# Patient Record
Sex: Female | Born: 1970 | Race: White | Hispanic: Yes | Marital: Married | Smoking: Never smoker
Health system: Southern US, Community
[De-identification: ages and names within clinical notes are randomized; demographics above are authoritative.]

## PROBLEM LIST (undated history)

## (undated) DIAGNOSIS — E785 Hyperlipidemia, unspecified: Secondary | ICD-10-CM

## (undated) DIAGNOSIS — M1289 Other specific arthropathies, not elsewhere classified, multiple sites: Secondary | ICD-10-CM

## (undated) DIAGNOSIS — N189 Chronic kidney disease, unspecified: Secondary | ICD-10-CM

## (undated) DIAGNOSIS — R7303 Prediabetes: Secondary | ICD-10-CM

## (undated) DIAGNOSIS — K219 Gastro-esophageal reflux disease without esophagitis: Secondary | ICD-10-CM

## (undated) HISTORY — DX: Chronic kidney disease, unspecified: N18.9

## (undated) HISTORY — DX: Hyperlipidemia, unspecified: E78.5

## (undated) HISTORY — DX: Prediabetes: R73.03

---

## 1995-05-31 DIAGNOSIS — M069 Rheumatoid arthritis, unspecified: Secondary | ICD-10-CM

## 1995-05-31 HISTORY — DX: Rheumatoid arthritis, unspecified: M06.9

## 2004-05-30 HISTORY — PX: TUBAL LIGATION: SHX77

## 2005-01-18 ENCOUNTER — Ambulatory Visit: Payer: Self-pay | Admitting: Internal Medicine

## 2005-01-21 ENCOUNTER — Ambulatory Visit: Payer: Self-pay | Admitting: Internal Medicine

## 2005-01-24 ENCOUNTER — Ambulatory Visit: Payer: Self-pay | Admitting: *Deleted

## 2005-01-28 LAB — CONVERTED CEMR LAB: Pap Smear: NEGATIVE

## 2005-02-23 ENCOUNTER — Ambulatory Visit: Payer: Self-pay | Admitting: Internal Medicine

## 2005-02-23 ENCOUNTER — Encounter (INDEPENDENT_AMBULATORY_CARE_PROVIDER_SITE_OTHER): Payer: Self-pay | Admitting: Internal Medicine

## 2005-02-23 LAB — CONVERTED CEMR LAB: Pap Smear: NORMAL

## 2005-04-25 ENCOUNTER — Ambulatory Visit: Payer: Self-pay | Admitting: Internal Medicine

## 2005-05-05 ENCOUNTER — Ambulatory Visit: Payer: Self-pay | Admitting: Internal Medicine

## 2005-06-09 ENCOUNTER — Ambulatory Visit: Payer: Self-pay | Admitting: Internal Medicine

## 2005-07-13 ENCOUNTER — Ambulatory Visit: Payer: Self-pay | Admitting: Internal Medicine

## 2005-09-07 ENCOUNTER — Ambulatory Visit: Payer: Self-pay | Admitting: Internal Medicine

## 2005-10-13 ENCOUNTER — Ambulatory Visit: Payer: Self-pay | Admitting: Obstetrics and Gynecology

## 2005-10-25 ENCOUNTER — Ambulatory Visit: Payer: Self-pay | Admitting: Internal Medicine

## 2005-11-07 ENCOUNTER — Ambulatory Visit (HOSPITAL_COMMUNITY): Admission: RE | Admit: 2005-11-07 | Discharge: 2005-11-07 | Payer: Self-pay | Admitting: Obstetrics and Gynecology

## 2005-11-07 ENCOUNTER — Ambulatory Visit: Payer: Self-pay | Admitting: Obstetrics and Gynecology

## 2005-11-17 ENCOUNTER — Ambulatory Visit (HOSPITAL_COMMUNITY): Admission: RE | Admit: 2005-11-17 | Discharge: 2005-11-17 | Payer: Self-pay | Admitting: Internal Medicine

## 2005-12-01 ENCOUNTER — Ambulatory Visit: Payer: Self-pay | Admitting: Obstetrics & Gynecology

## 2006-01-26 ENCOUNTER — Ambulatory Visit: Payer: Self-pay | Admitting: Internal Medicine

## 2006-04-06 ENCOUNTER — Ambulatory Visit: Payer: Self-pay | Admitting: Internal Medicine

## 2006-05-24 ENCOUNTER — Ambulatory Visit: Payer: Self-pay | Admitting: Internal Medicine

## 2006-06-27 ENCOUNTER — Ambulatory Visit: Payer: Self-pay | Admitting: Internal Medicine

## 2006-07-06 ENCOUNTER — Ambulatory Visit: Payer: Self-pay | Admitting: Internal Medicine

## 2006-08-15 ENCOUNTER — Ambulatory Visit: Payer: Self-pay | Admitting: Internal Medicine

## 2006-09-18 ENCOUNTER — Ambulatory Visit: Payer: Self-pay | Admitting: Internal Medicine

## 2007-01-06 ENCOUNTER — Encounter (INDEPENDENT_AMBULATORY_CARE_PROVIDER_SITE_OTHER): Payer: Self-pay | Admitting: Internal Medicine

## 2007-01-06 DIAGNOSIS — M069 Rheumatoid arthritis, unspecified: Secondary | ICD-10-CM | POA: Insufficient documentation

## 2007-01-16 ENCOUNTER — Ambulatory Visit: Payer: Self-pay | Admitting: Internal Medicine

## 2007-01-17 ENCOUNTER — Encounter (INDEPENDENT_AMBULATORY_CARE_PROVIDER_SITE_OTHER): Payer: Self-pay | Admitting: Internal Medicine

## 2007-02-14 ENCOUNTER — Encounter (INDEPENDENT_AMBULATORY_CARE_PROVIDER_SITE_OTHER): Payer: Self-pay | Admitting: *Deleted

## 2007-04-19 ENCOUNTER — Ambulatory Visit: Payer: Self-pay | Admitting: Nurse Practitioner

## 2007-04-19 DIAGNOSIS — J00 Acute nasopharyngitis [common cold]: Secondary | ICD-10-CM | POA: Insufficient documentation

## 2007-05-22 ENCOUNTER — Encounter (INDEPENDENT_AMBULATORY_CARE_PROVIDER_SITE_OTHER): Payer: Self-pay | Admitting: Internal Medicine

## 2007-05-22 ENCOUNTER — Ambulatory Visit: Payer: Self-pay | Admitting: Internal Medicine

## 2007-05-22 DIAGNOSIS — K089 Disorder of teeth and supporting structures, unspecified: Secondary | ICD-10-CM | POA: Insufficient documentation

## 2007-05-22 LAB — CONVERTED CEMR LAB
KOH Prep: NEGATIVE
Whiff Test: NEGATIVE

## 2007-05-23 ENCOUNTER — Encounter (INDEPENDENT_AMBULATORY_CARE_PROVIDER_SITE_OTHER): Payer: Self-pay | Admitting: Internal Medicine

## 2007-06-06 ENCOUNTER — Encounter (INDEPENDENT_AMBULATORY_CARE_PROVIDER_SITE_OTHER): Payer: Self-pay | Admitting: Internal Medicine

## 2007-06-06 LAB — CONVERTED CEMR LAB
AST: 18 units/L (ref 0–37)
Albumin: 4.3 g/dL (ref 3.5–5.2)
Alkaline Phosphatase: 46 units/L (ref 39–117)
BUN: 14 mg/dL (ref 6–23)
Basophils Absolute: 0 10*3/uL (ref 0.0–0.1)
Eosinophils Relative: 5 % (ref 0–5)
GC Probe Amp, Genital: NEGATIVE
LDL Cholesterol: 33 mg/dL (ref 0–99)
Lymphs Abs: 1.2 10*3/uL (ref 0.7–4.0)
MCV: 92.1 fL (ref 78.0–100.0)
Monocytes Absolute: 0.5 10*3/uL (ref 0.1–1.0)
Neutro Abs: 3 10*3/uL (ref 1.7–7.7)
Neutrophils Relative %: 60 % (ref 43–77)
Platelets: 280 10*3/uL (ref 150–400)
Potassium: 4.4 meq/L (ref 3.5–5.3)
RBC: 4.19 M/uL (ref 3.87–5.11)
RDW: 12.8 % (ref 11.5–15.5)
Total Bilirubin: 0.3 mg/dL (ref 0.3–1.2)
VLDL: 74 mg/dL — ABNORMAL HIGH (ref 0–40)

## 2007-06-13 ENCOUNTER — Encounter (INDEPENDENT_AMBULATORY_CARE_PROVIDER_SITE_OTHER): Payer: Self-pay | Admitting: Internal Medicine

## 2007-08-10 ENCOUNTER — Ambulatory Visit: Payer: Self-pay | Admitting: Internal Medicine

## 2007-08-10 DIAGNOSIS — R519 Headache, unspecified: Secondary | ICD-10-CM | POA: Insufficient documentation

## 2007-08-10 DIAGNOSIS — L84 Corns and callosities: Secondary | ICD-10-CM | POA: Insufficient documentation

## 2007-08-10 DIAGNOSIS — E785 Hyperlipidemia, unspecified: Secondary | ICD-10-CM

## 2007-08-10 DIAGNOSIS — R51 Headache: Secondary | ICD-10-CM

## 2007-08-13 ENCOUNTER — Ambulatory Visit: Payer: Self-pay | Admitting: Internal Medicine

## 2007-08-18 LAB — CONVERTED CEMR LAB
ALT: 12 units/L (ref 0–35)
Albumin: 4.1 g/dL (ref 3.5–5.2)
Basophils Relative: 0 % (ref 0–1)
CO2: 21 meq/L (ref 19–32)
Calcium: 9.1 mg/dL (ref 8.4–10.5)
Chloride: 104 meq/L (ref 96–112)
Eosinophils Relative: 4 % (ref 0–5)
Glucose, Bld: 85 mg/dL (ref 70–99)
Lymphocytes Relative: 23 % (ref 12–46)
Lymphs Abs: 1.2 10*3/uL (ref 0.7–4.0)
MCHC: 31.9 g/dL (ref 30.0–36.0)
Neutro Abs: 3.2 10*3/uL (ref 1.7–7.7)
Neutrophils Relative %: 63 % (ref 43–77)
Potassium: 4 meq/L (ref 3.5–5.3)
Sodium: 139 meq/L (ref 135–145)
Total Protein: 7.5 g/dL (ref 6.0–8.3)

## 2007-09-26 ENCOUNTER — Ambulatory Visit: Payer: Self-pay | Admitting: Family Medicine

## 2007-09-26 DIAGNOSIS — J301 Allergic rhinitis due to pollen: Secondary | ICD-10-CM

## 2007-09-26 LAB — CONVERTED CEMR LAB
AST: 18 units/L (ref 0–37)
Alkaline Phosphatase: 37 units/L — ABNORMAL LOW (ref 39–117)
BUN: 19 mg/dL (ref 6–23)
CO2: 21 meq/L (ref 19–32)
Creatinine, Ser: 0.65 mg/dL (ref 0.40–1.20)
Hemoglobin: 13 g/dL (ref 12.0–15.0)
MCV: 95.3 fL (ref 78.0–100.0)
Monocytes Absolute: 0.8 10*3/uL (ref 0.1–1.0)
Monocytes Relative: 10 % (ref 3–12)
Neutro Abs: 5.2 10*3/uL (ref 1.7–7.7)
Neutrophils Relative %: 66 % (ref 43–77)
Platelets: 283 10*3/uL (ref 150–400)
RDW: 13.5 % (ref 11.5–15.5)
Sodium: 138 meq/L (ref 135–145)
Total Bilirubin: 0.6 mg/dL (ref 0.3–1.2)
Total Protein: 6.8 g/dL (ref 6.0–8.3)

## 2007-10-04 ENCOUNTER — Ambulatory Visit: Payer: Self-pay | Admitting: Internal Medicine

## 2007-10-17 ENCOUNTER — Ambulatory Visit: Payer: Self-pay | Admitting: Internal Medicine

## 2007-11-20 ENCOUNTER — Encounter (INDEPENDENT_AMBULATORY_CARE_PROVIDER_SITE_OTHER): Payer: Self-pay | Admitting: Internal Medicine

## 2007-11-27 ENCOUNTER — Encounter (INDEPENDENT_AMBULATORY_CARE_PROVIDER_SITE_OTHER): Payer: Self-pay | Admitting: Internal Medicine

## 2007-11-27 ENCOUNTER — Ambulatory Visit: Payer: Self-pay | Admitting: Nurse Practitioner

## 2007-12-13 ENCOUNTER — Encounter (INDEPENDENT_AMBULATORY_CARE_PROVIDER_SITE_OTHER): Payer: Self-pay | Admitting: Internal Medicine

## 2007-12-19 LAB — CONVERTED CEMR LAB
ALT: 10 units/L (ref 0–35)
AST: 16 units/L (ref 0–37)
Albumin: 3.9 g/dL (ref 3.5–5.2)
BUN: 12 mg/dL (ref 6–23)
Basophils Absolute: 0 10*3/uL (ref 0.0–0.1)
Basophils Relative: 1 % (ref 0–1)
CO2: 22 meq/L (ref 19–32)
Calcium: 9.1 mg/dL (ref 8.4–10.5)
Chloride: 107 meq/L (ref 96–112)
Eosinophils Absolute: 0.2 10*3/uL (ref 0.0–0.7)
Eosinophils Relative: 4 % (ref 0–5)
Glucose, Bld: 84 mg/dL (ref 70–99)
Monocytes Absolute: 0.9 10*3/uL (ref 0.1–1.0)
Neutro Abs: 3 10*3/uL (ref 1.7–7.7)
Potassium: 4 meq/L (ref 3.5–5.3)
Sodium: 138 meq/L (ref 135–145)
Total Bilirubin: 0.7 mg/dL (ref 0.3–1.2)
WBC: 5.5 10*3/uL (ref 4.0–10.5)

## 2008-01-15 ENCOUNTER — Ambulatory Visit: Payer: Self-pay | Admitting: Internal Medicine

## 2008-01-22 LAB — CONVERTED CEMR LAB
ALT: 12 units/L (ref 0–35)
AST: 17 units/L (ref 0–37)
Albumin: 4.2 g/dL (ref 3.5–5.2)
Basophils Relative: 0 % (ref 0–1)
Chloride: 108 meq/L (ref 96–112)
Eosinophils Relative: 5 % (ref 0–5)
Glucose, Bld: 102 mg/dL — ABNORMAL HIGH (ref 70–99)
Lymphocytes Relative: 27 % (ref 12–46)
Monocytes Absolute: 0.7 10*3/uL (ref 0.1–1.0)
Sodium: 142 meq/L (ref 135–145)
Total Bilirubin: 0.7 mg/dL (ref 0.3–1.2)

## 2008-02-05 ENCOUNTER — Telehealth (INDEPENDENT_AMBULATORY_CARE_PROVIDER_SITE_OTHER): Payer: Self-pay | Admitting: *Deleted

## 2008-02-06 ENCOUNTER — Ambulatory Visit: Payer: Self-pay | Admitting: Internal Medicine

## 2008-02-06 DIAGNOSIS — N3000 Acute cystitis without hematuria: Secondary | ICD-10-CM | POA: Insufficient documentation

## 2008-02-06 LAB — CONVERTED CEMR LAB
Ketones, urine, test strip: NEGATIVE
Nitrite: NEGATIVE
Protein, U semiquant: NEGATIVE
WBC Urine, dipstick: NEGATIVE

## 2008-02-07 ENCOUNTER — Encounter (INDEPENDENT_AMBULATORY_CARE_PROVIDER_SITE_OTHER): Payer: Self-pay | Admitting: Internal Medicine

## 2008-04-07 ENCOUNTER — Telehealth (INDEPENDENT_AMBULATORY_CARE_PROVIDER_SITE_OTHER): Payer: Self-pay | Admitting: *Deleted

## 2008-04-10 ENCOUNTER — Ambulatory Visit: Payer: Self-pay | Admitting: Internal Medicine

## 2008-04-10 DIAGNOSIS — M545 Low back pain: Secondary | ICD-10-CM

## 2008-04-10 DIAGNOSIS — M25559 Pain in unspecified hip: Secondary | ICD-10-CM

## 2008-04-18 LAB — CONVERTED CEMR LAB
Alkaline Phosphatase: 44 units/L (ref 39–117)
BUN: 14 mg/dL (ref 6–23)
CO2: 24 meq/L (ref 19–32)
Calcium: 9.3 mg/dL (ref 8.4–10.5)
Chloride: 104 meq/L (ref 96–112)
Eosinophils Absolute: 0.4 10*3/uL (ref 0.0–0.7)
Eosinophils Relative: 7 % — ABNORMAL HIGH (ref 0–5)
Lymphocytes Relative: 32 % (ref 12–46)
MCHC: 32 g/dL (ref 30.0–36.0)
MCV: 95 fL (ref 78.0–100.0)
Monocytes Absolute: 0.8 10*3/uL (ref 0.1–1.0)
Neutro Abs: 2.5 10*3/uL (ref 1.7–7.7)
Neutrophils Relative %: 46 % (ref 43–77)
RBC: 4.41 M/uL (ref 3.87–5.11)
RDW: 13.4 % (ref 11.5–15.5)
Sodium: 140 meq/L (ref 135–145)
Total Bilirubin: 0.7 mg/dL (ref 0.3–1.2)
Total Protein: 7.7 g/dL (ref 6.0–8.3)

## 2008-05-21 ENCOUNTER — Ambulatory Visit: Payer: Self-pay | Admitting: Internal Medicine

## 2008-05-21 LAB — CONVERTED CEMR LAB
AST: 23 units/L (ref 0–37)
BUN: 12 mg/dL (ref 6–23)
Basophils Relative: 0 % (ref 0–1)
CO2: 22 meq/L (ref 19–32)
Calcium: 8.9 mg/dL (ref 8.4–10.5)
Chloride: 106 meq/L (ref 96–112)
Glucose, Bld: 76 mg/dL (ref 70–99)
Lymphs Abs: 1.5 10*3/uL (ref 0.7–4.0)
MCV: 94.1 fL (ref 78.0–100.0)
Monocytes Absolute: 0.6 10*3/uL (ref 0.1–1.0)
Monocytes Relative: 11 % (ref 3–12)
Neutrophils Relative %: 57 % (ref 43–77)
Sodium: 139 meq/L (ref 135–145)
Total Bilirubin: 0.5 mg/dL (ref 0.3–1.2)
Total Protein: 6.9 g/dL (ref 6.0–8.3)
WBC: 5.3 10*3/uL (ref 4.0–10.5)

## 2008-05-27 ENCOUNTER — Encounter (INDEPENDENT_AMBULATORY_CARE_PROVIDER_SITE_OTHER): Payer: Self-pay | Admitting: Internal Medicine

## 2008-05-27 ENCOUNTER — Ambulatory Visit: Payer: Self-pay | Admitting: Internal Medicine

## 2008-05-27 DIAGNOSIS — R109 Unspecified abdominal pain: Secondary | ICD-10-CM

## 2008-05-27 LAB — CONVERTED CEMR LAB: Bilirubin Urine: NEGATIVE

## 2008-05-28 ENCOUNTER — Encounter (INDEPENDENT_AMBULATORY_CARE_PROVIDER_SITE_OTHER): Payer: Self-pay | Admitting: Internal Medicine

## 2008-06-09 ENCOUNTER — Encounter (INDEPENDENT_AMBULATORY_CARE_PROVIDER_SITE_OTHER): Payer: Self-pay | Admitting: Internal Medicine

## 2008-06-17 ENCOUNTER — Ambulatory Visit: Payer: Self-pay | Admitting: Internal Medicine

## 2008-06-17 LAB — CONVERTED CEMR LAB
Glucose, Urine, Semiquant: NEGATIVE
Ketones, urine, test strip: NEGATIVE
Nitrite: NEGATIVE
Specific Gravity, Urine: 1.025
pH: 5

## 2008-06-18 ENCOUNTER — Encounter (INDEPENDENT_AMBULATORY_CARE_PROVIDER_SITE_OTHER): Payer: Self-pay | Admitting: Internal Medicine

## 2008-06-19 LAB — CONVERTED CEMR LAB
BUN: 15 mg/dL (ref 6–23)
Eosinophils Absolute: 0.1 10*3/uL (ref 0.0–0.7)
Glucose, Bld: 90 mg/dL (ref 70–99)
HCT: 42.9 % (ref 36.0–46.0)
Hemoglobin: 13.7 g/dL (ref 12.0–15.0)
MCV: 94.5 fL (ref 78.0–100.0)
Monocytes Absolute: 0.6 10*3/uL (ref 0.1–1.0)
Monocytes Relative: 12 % (ref 3–12)
Neutrophils Relative %: 61 % (ref 43–77)
RDW: 13.4 % (ref 11.5–15.5)
WBC: 4.7 10*3/uL (ref 4.0–10.5)

## 2008-06-24 ENCOUNTER — Ambulatory Visit: Payer: Self-pay | Admitting: Internal Medicine

## 2008-06-24 DIAGNOSIS — R3129 Other microscopic hematuria: Secondary | ICD-10-CM

## 2008-06-24 LAB — CONVERTED CEMR LAB
Glucose, Urine, Semiquant: NEGATIVE
Ketones, urine, test strip: NEGATIVE
Protein, U semiquant: NEGATIVE
pH: 5

## 2008-06-25 ENCOUNTER — Encounter (INDEPENDENT_AMBULATORY_CARE_PROVIDER_SITE_OTHER): Payer: Self-pay | Admitting: Internal Medicine

## 2008-06-27 ENCOUNTER — Ambulatory Visit (HOSPITAL_COMMUNITY): Admission: RE | Admit: 2008-06-27 | Discharge: 2008-06-27 | Payer: Self-pay | Admitting: Internal Medicine

## 2008-07-02 ENCOUNTER — Ambulatory Visit: Payer: Self-pay | Admitting: Internal Medicine

## 2008-07-03 LAB — CONVERTED CEMR LAB
AST: 15 units/L (ref 0–37)
Albumin: 3.9 g/dL (ref 3.5–5.2)
BUN: 16 mg/dL (ref 6–23)
CO2: 22 meq/L (ref 19–32)
Chloride: 106 meq/L (ref 96–112)
Cholesterol: 122 mg/dL (ref 0–200)
Creatinine, Ser: 0.69 mg/dL (ref 0.40–1.20)
Eosinophils Relative: 4 % (ref 0–5)
Glucose, Bld: 94 mg/dL (ref 70–99)
HCT: 41.7 % (ref 36.0–46.0)
HDL: 33 mg/dL — ABNORMAL LOW (ref >39)
Hemoglobin: 13.9 g/dL (ref 12.0–15.0)
Lymphocytes Relative: 21 % (ref 12–46)
Lymphs Abs: 0.9 10*3/uL (ref 0.7–4.0)
MCV: 92.5 fL (ref 78.0–100.0)
Monocytes Absolute: 0.4 10*3/uL (ref 0.1–1.0)
Monocytes Relative: 8 % (ref 3–12)
RDW: 13.2 % (ref 11.5–15.5)
Total Bilirubin: 0.6 mg/dL (ref 0.3–1.2)
Triglycerides: 237 mg/dL — ABNORMAL HIGH (ref <150)
WBC: 4.5 10*3/uL (ref 4.0–10.5)

## 2008-07-24 ENCOUNTER — Telehealth (INDEPENDENT_AMBULATORY_CARE_PROVIDER_SITE_OTHER): Payer: Self-pay | Admitting: Internal Medicine

## 2008-07-29 ENCOUNTER — Ambulatory Visit: Payer: Self-pay | Admitting: Internal Medicine

## 2008-07-30 ENCOUNTER — Encounter (INDEPENDENT_AMBULATORY_CARE_PROVIDER_SITE_OTHER): Payer: Self-pay | Admitting: Internal Medicine

## 2008-08-27 LAB — CONVERTED CEMR LAB
ALT: 11 units/L (ref 0–35)
AST: 16 units/L (ref 0–37)
Albumin: 4.2 g/dL (ref 3.5–5.2)
Alkaline Phosphatase: 41 units/L (ref 39–117)
BUN: 13 mg/dL (ref 6–23)
Band Neutrophils: 0 % (ref 0–10)
CO2: 27 meq/L (ref 19–32)
Calcium: 9 mg/dL (ref 8.4–10.5)
Creatinine, Ser: 0.68 mg/dL (ref 0.40–1.20)
Glucose, Bld: 86 mg/dL (ref 70–99)
Hemoglobin: 13 g/dL (ref 12.0–15.0)
MCV: 93.3 fL (ref 78.0–100.0)
Monocytes Relative: 15 % — ABNORMAL HIGH (ref 3–12)
RBC: 4.3 M/uL (ref 3.87–5.11)
Sodium: 137 meq/L (ref 135–145)

## 2008-09-05 ENCOUNTER — Ambulatory Visit: Payer: Self-pay | Admitting: Nurse Practitioner

## 2008-10-03 ENCOUNTER — Telehealth (INDEPENDENT_AMBULATORY_CARE_PROVIDER_SITE_OTHER): Payer: Self-pay | Admitting: *Deleted

## 2008-10-06 ENCOUNTER — Ambulatory Visit: Payer: Self-pay | Admitting: Internal Medicine

## 2008-10-08 ENCOUNTER — Encounter (INDEPENDENT_AMBULATORY_CARE_PROVIDER_SITE_OTHER): Payer: Self-pay | Admitting: Internal Medicine

## 2008-10-09 LAB — CONVERTED CEMR LAB
AST: 19 units/L (ref 0–37)
Albumin: 4.2 g/dL (ref 3.5–5.2)
BUN: 17 mg/dL (ref 6–23)
CO2: 22 meq/L (ref 19–32)
Chloride: 104 meq/L (ref 96–112)
Creatinine, Ser: 0.75 mg/dL (ref 0.40–1.20)
Eosinophils Absolute: 0.6 10*3/uL (ref 0.0–0.7)
Eosinophils Relative: 11 % — ABNORMAL HIGH (ref 0–5)
HCT: 39.1 % (ref 36.0–46.0)
Hemoglobin: 13.2 g/dL (ref 12.0–15.0)
Lymphocytes Relative: 27 % (ref 12–46)
Lymphs Abs: 1.5 10*3/uL (ref 0.7–4.0)
MCV: 89.9 fL (ref 78.0–100.0)
Monocytes Absolute: 0.5 10*3/uL (ref 0.1–1.0)
Monocytes Relative: 9 % (ref 3–12)
RBC: 4.35 M/uL (ref 3.87–5.11)
RDW: 13.3 % (ref 11.5–15.5)
Sodium: 137 meq/L (ref 135–145)
Total Protein: 7.3 g/dL (ref 6.0–8.3)

## 2008-10-14 ENCOUNTER — Emergency Department (HOSPITAL_COMMUNITY): Admission: EM | Admit: 2008-10-14 | Discharge: 2008-10-14 | Payer: Self-pay | Admitting: Emergency Medicine

## 2008-10-14 ENCOUNTER — Telehealth (INDEPENDENT_AMBULATORY_CARE_PROVIDER_SITE_OTHER): Payer: Self-pay | Admitting: Internal Medicine

## 2008-12-11 ENCOUNTER — Ambulatory Visit: Payer: Self-pay | Admitting: Internal Medicine

## 2008-12-11 LAB — CONVERTED CEMR LAB
AST: 16 units/L (ref 0–37)
Albumin: 4.2 g/dL (ref 3.5–5.2)
BUN: 11 mg/dL (ref 6–23)
Basophils Absolute: 0 10*3/uL (ref 0.0–0.1)
Beta hcg, urine, semiquantitative: NEGATIVE
Blood in Urine, dipstick: NEGATIVE
Eosinophils Absolute: 0.3 10*3/uL (ref 0.0–0.7)
Glucose, Urine, Semiquant: NEGATIVE
HCT: 36.1 % (ref 36.0–46.0)
Hemoglobin: 12.4 g/dL (ref 12.0–15.0)
Lymphocytes Relative: 29 % (ref 12–46)
MCHC: 34.3 g/dL (ref 30.0–36.0)
Monocytes Absolute: 0.7 10*3/uL (ref 0.1–1.0)
Monocytes Relative: 11 % (ref 3–12)
Neutrophils Relative %: 56 % (ref 43–77)
Platelets: 293 10*3/uL (ref 150–400)
RDW: 13.1 % (ref 11.5–15.5)
Total Bilirubin: 0.5 mg/dL (ref 0.3–1.2)
Urobilinogen, UA: 0.2

## 2008-12-15 ENCOUNTER — Encounter (INDEPENDENT_AMBULATORY_CARE_PROVIDER_SITE_OTHER): Payer: Self-pay | Admitting: Internal Medicine

## 2008-12-27 ENCOUNTER — Encounter (INDEPENDENT_AMBULATORY_CARE_PROVIDER_SITE_OTHER): Payer: Self-pay | Admitting: Internal Medicine

## 2009-02-05 ENCOUNTER — Telehealth (INDEPENDENT_AMBULATORY_CARE_PROVIDER_SITE_OTHER): Payer: Self-pay | Admitting: Internal Medicine

## 2009-02-13 ENCOUNTER — Ambulatory Visit: Payer: Self-pay | Admitting: Internal Medicine

## 2009-02-23 LAB — CONVERTED CEMR LAB
AST: 20 units/L (ref 0–37)
CO2: 24 meq/L (ref 19–32)
Eosinophils Absolute: 0.3 10*3/uL (ref 0.0–0.7)
Lymphs Abs: 1.8 10*3/uL (ref 0.7–4.0)
MCHC: 32.7 g/dL (ref 30.0–36.0)
MCV: 92.8 fL (ref 78.0–100.0)
Monocytes Relative: 14 % — ABNORMAL HIGH (ref 3–12)
Neutro Abs: 2.4 10*3/uL (ref 1.7–7.7)
Neutrophils Relative %: 46 % (ref 43–77)
Platelets: 276 10*3/uL (ref 150–400)
Potassium: 4.1 meq/L (ref 3.5–5.3)
RBC: 4.15 M/uL (ref 3.87–5.11)
RDW: 13.2 % (ref 11.5–15.5)
Total Protein: 6.7 g/dL (ref 6.0–8.3)
WBC: 5.3 10*3/uL (ref 4.0–10.5)

## 2009-03-25 ENCOUNTER — Encounter (INDEPENDENT_AMBULATORY_CARE_PROVIDER_SITE_OTHER): Payer: Self-pay | Admitting: Internal Medicine

## 2009-04-16 ENCOUNTER — Ambulatory Visit: Payer: Self-pay | Admitting: Internal Medicine

## 2009-05-11 LAB — CONVERTED CEMR LAB
ALT: 13 units/L (ref 0–35)
AST: 15 units/L (ref 0–37)
Basophils Absolute: 0 10*3/uL (ref 0.0–0.1)
CO2: 23 meq/L (ref 19–32)
Calcium: 8.5 mg/dL (ref 8.4–10.5)
Chloride: 104 meq/L (ref 96–112)
Creatinine, Ser: 0.77 mg/dL (ref 0.40–1.20)
Eosinophils Relative: 4 % (ref 0–5)
Glucose, Bld: 96 mg/dL (ref 70–99)
HCT: 39.8 % (ref 36.0–46.0)
Hemoglobin: 13 g/dL (ref 12.0–15.0)
Monocytes Absolute: 0.6 10*3/uL (ref 0.1–1.0)
Monocytes Relative: 11 % (ref 3–12)
Neutro Abs: 3 10*3/uL (ref 1.7–7.7)
Platelets: 264 10*3/uL (ref 150–400)
Potassium: 3.8 meq/L (ref 3.5–5.3)
Sodium: 138 meq/L (ref 135–145)
Total Protein: 7 g/dL (ref 6.0–8.3)
WBC: 5.5 10*3/uL (ref 4.0–10.5)

## 2009-05-15 ENCOUNTER — Ambulatory Visit: Payer: Self-pay | Admitting: Internal Medicine

## 2009-05-15 DIAGNOSIS — B373 Candidiasis of vulva and vagina: Secondary | ICD-10-CM

## 2009-05-15 LAB — CONVERTED CEMR LAB
Bilirubin Urine: NEGATIVE
Nitrite: NEGATIVE
Specific Gravity, Urine: 1.02
WBC Urine, dipstick: NEGATIVE
Whiff Test: NEGATIVE

## 2009-06-02 ENCOUNTER — Encounter (INDEPENDENT_AMBULATORY_CARE_PROVIDER_SITE_OTHER): Payer: Self-pay | Admitting: Internal Medicine

## 2009-06-02 ENCOUNTER — Ambulatory Visit: Payer: Self-pay | Admitting: Internal Medicine

## 2009-06-05 ENCOUNTER — Ambulatory Visit: Payer: Self-pay | Admitting: Internal Medicine

## 2009-06-05 LAB — CONVERTED CEMR LAB
ALT: 15 units/L (ref 0–35)
Alkaline Phosphatase: 33 units/L — ABNORMAL LOW (ref 39–117)
BUN: 14 mg/dL (ref 6–23)
Basophils Relative: 0 % (ref 0–1)
CO2: 21 meq/L (ref 19–32)
Chloride: 103 meq/L (ref 96–112)
Eosinophils Absolute: 0.3 10*3/uL (ref 0.0–0.7)
Hemoglobin: 13.5 g/dL (ref 12.0–15.0)
Lymphs Abs: 2.1 10*3/uL (ref 0.7–4.0)
MCV: 95.8 fL (ref 78.0–100.0)
Monocytes Absolute: 0.7 10*3/uL (ref 0.1–1.0)
RDW: 12.6 % (ref 11.5–15.5)
Sodium: 138 meq/L (ref 135–145)

## 2009-06-17 IMAGING — US US RENAL
1 series · 14 of 25 positions shown · non-contrast
Comparison: None

CLINICAL DATA: Microhematuria

RENAL/URINARY TRACT ULTRASOUND
TECHNIQUE: Complete ultrasound examination of the urinary tract
was performed including evaluation of the kidneys, renal collecting
systems, and urinary bladder.

[Series 1: us renal · 0.25mm/px · 14 of 29 slices shown]
[im 1/29]
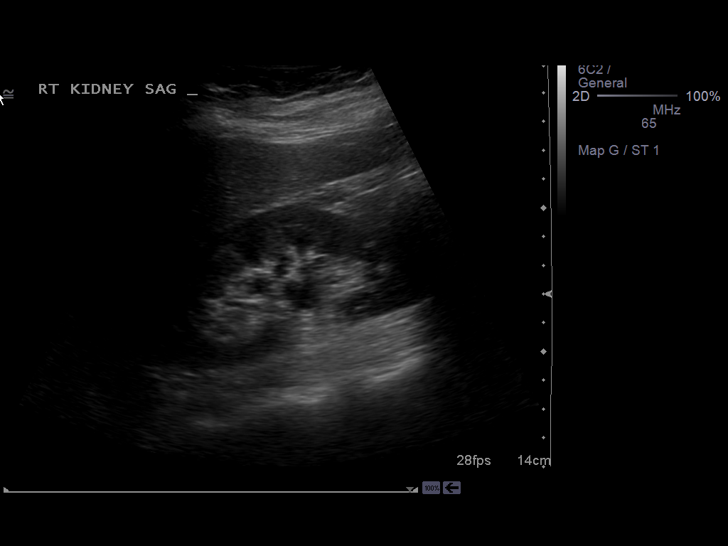
[im 3/29]
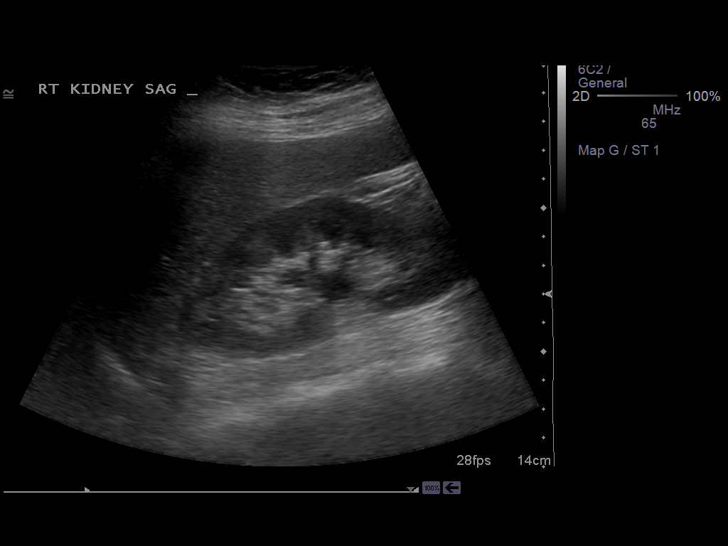
[im 5/29]
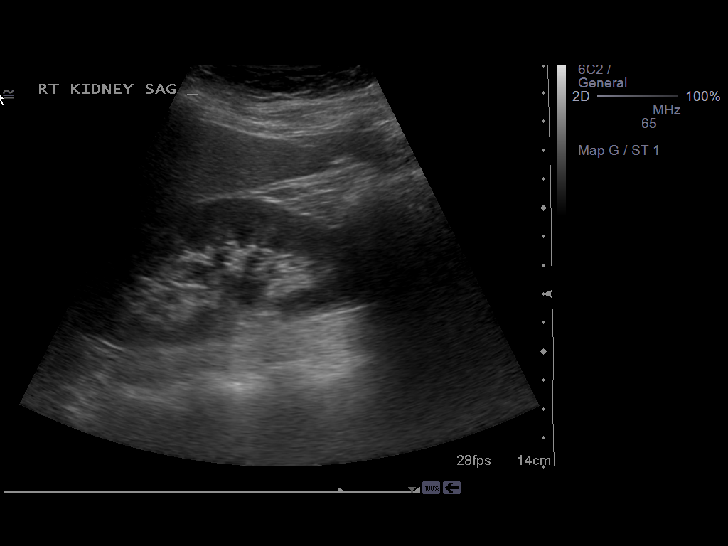
[im 8/29]
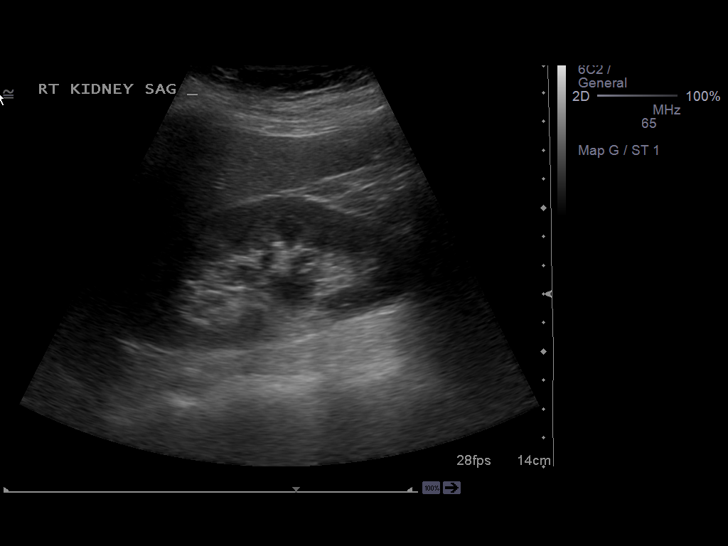
[im 10/29]
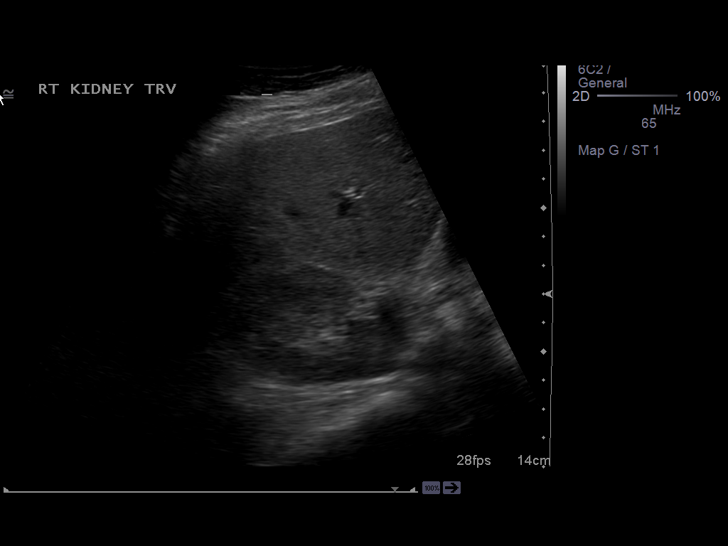
[im 11/29]
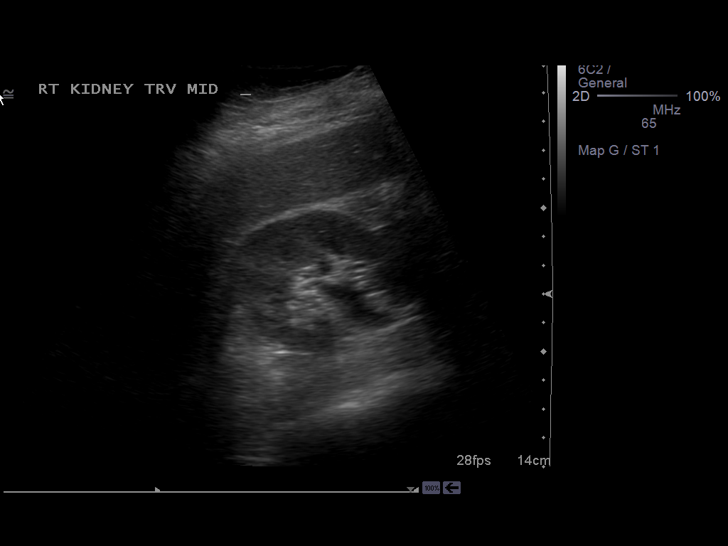
[im 13/29]
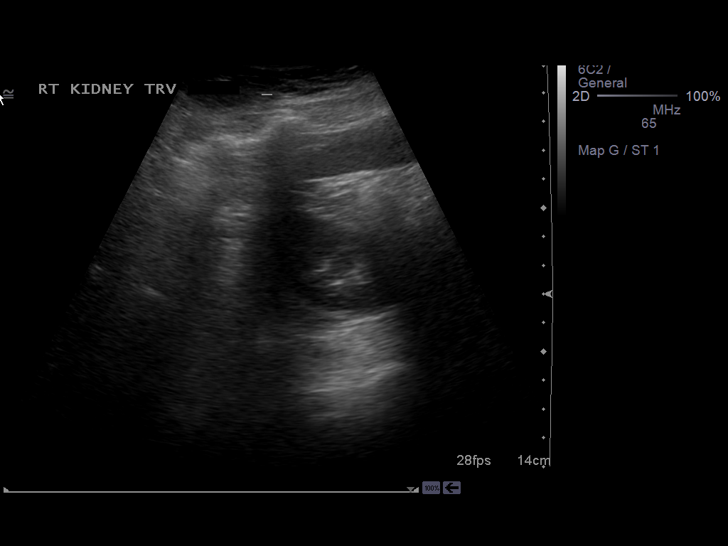
[im 16/29]
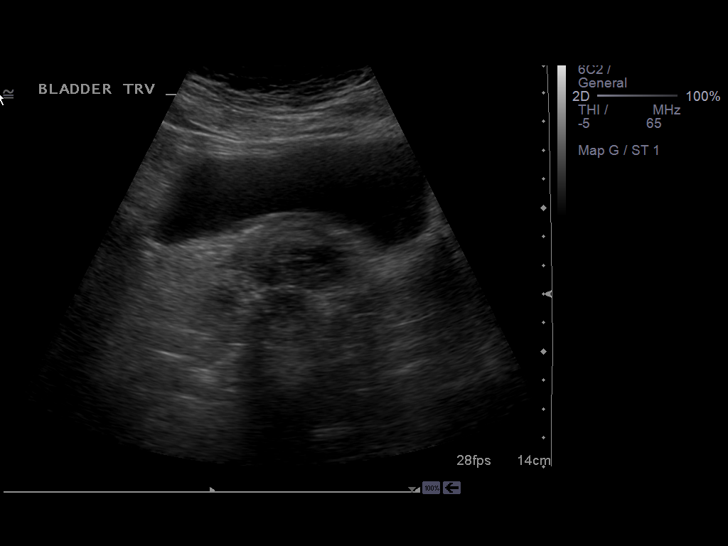
[im 18/29]
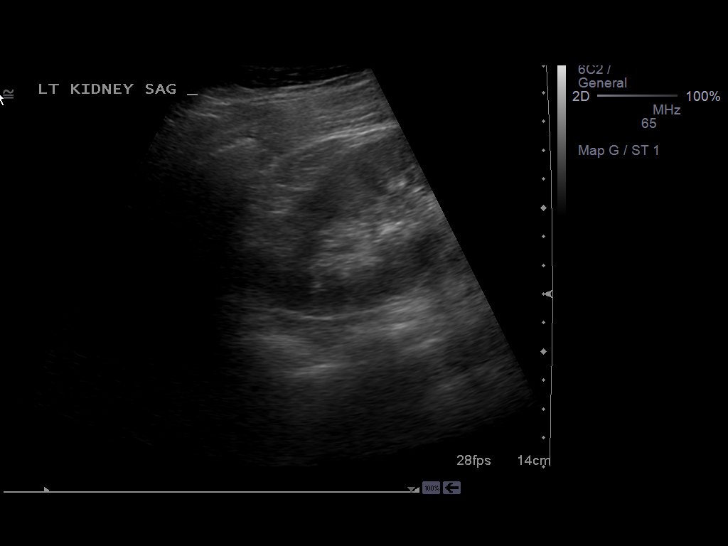
[im 19/29]
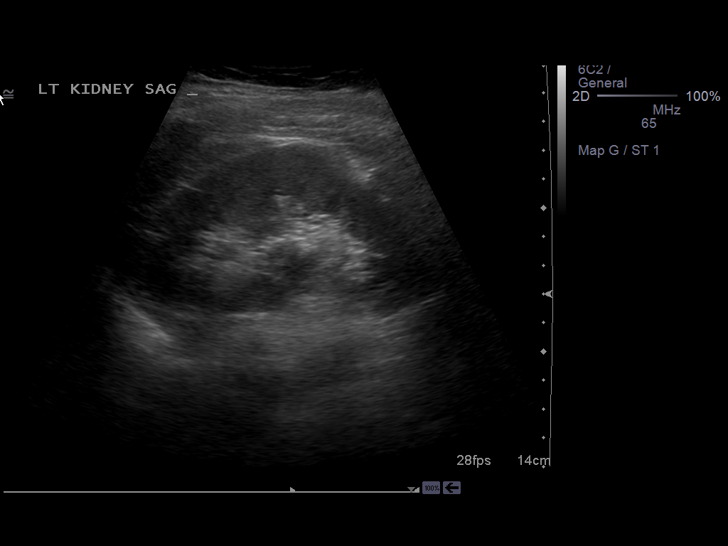
[im 22/29]
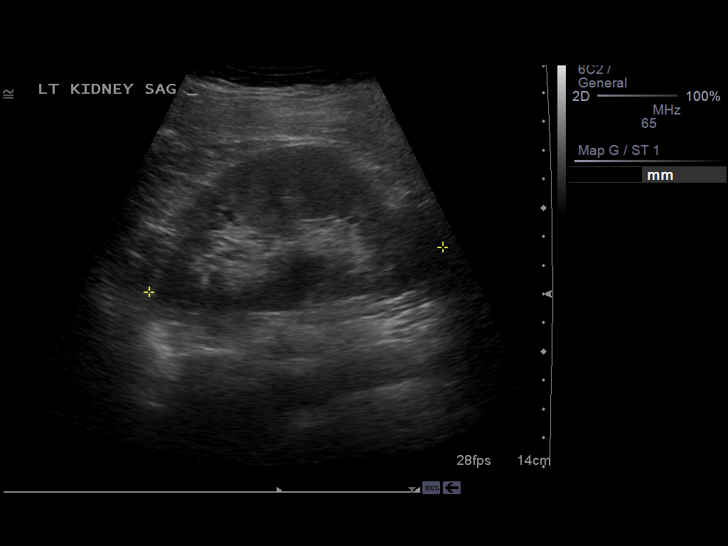
[im 24/29]
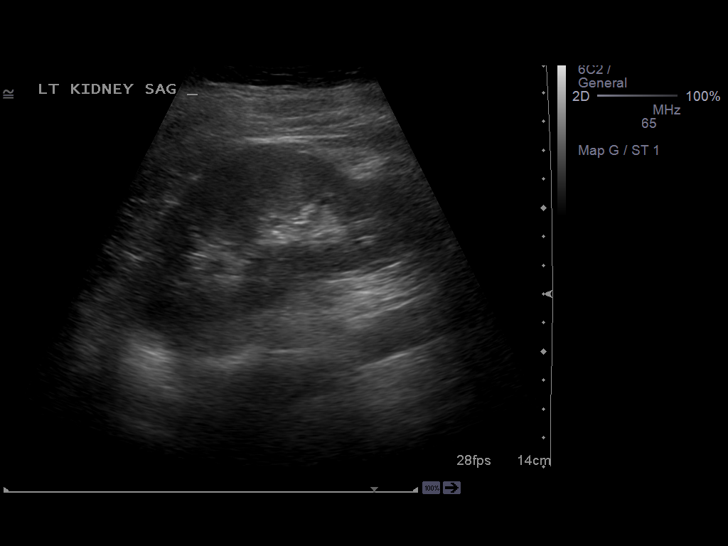
[im 26/29]
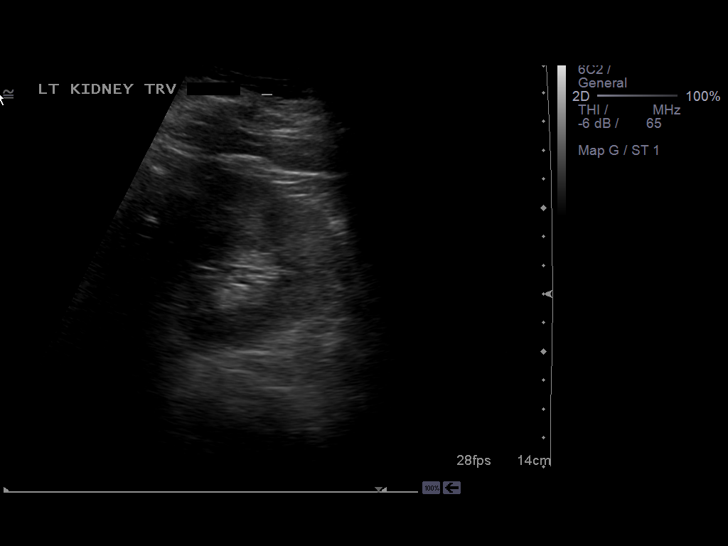
[im 29/29]
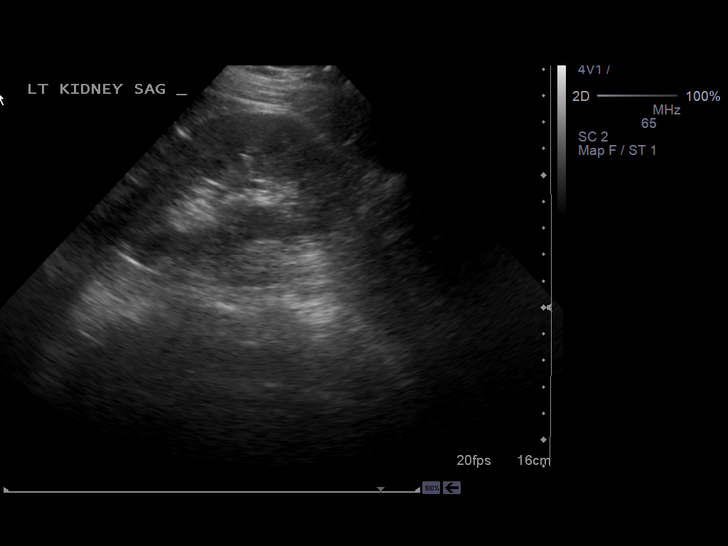

[14 of 25 positions shown; findings below may reference images not displayed]

FINDINGS: No hydronephrosis is seen.  The right kidney measures
10.8 cm sagittally, with left kidney measuring 10.4 cm.  No solid
or cystic renal lesion is seen.  No echogenic foci are noted to
indicate calculi.  Urinary bladder is unremarkable.
IMPRESSION: No hydronephrosis.  No renal mass.  No definite calculi.

## 2009-06-22 ENCOUNTER — Ambulatory Visit: Payer: Self-pay | Admitting: Internal Medicine

## 2009-06-23 ENCOUNTER — Encounter (INDEPENDENT_AMBULATORY_CARE_PROVIDER_SITE_OTHER): Payer: Self-pay | Admitting: Internal Medicine

## 2009-06-24 ENCOUNTER — Ambulatory Visit: Payer: Self-pay | Admitting: Physician Assistant

## 2009-06-24 ENCOUNTER — Encounter (INDEPENDENT_AMBULATORY_CARE_PROVIDER_SITE_OTHER): Payer: Self-pay | Admitting: Internal Medicine

## 2009-07-02 LAB — CONVERTED CEMR LAB
Collection Interval-CRCL: 24 hr
Creatinine 24 HR UR: 1562 mg/24hr (ref 700–1800)
Creatinine Clearance: 132 mL/min — ABNORMAL HIGH (ref 75–115)
Creatinine, Urine: 156.2 mg/dL

## 2009-07-09 ENCOUNTER — Encounter (INDEPENDENT_AMBULATORY_CARE_PROVIDER_SITE_OTHER): Payer: Self-pay | Admitting: Internal Medicine

## 2009-07-17 ENCOUNTER — Ambulatory Visit: Payer: Self-pay | Admitting: Internal Medicine

## 2009-08-01 LAB — CONVERTED CEMR LAB
ALT: 14 units/L (ref 0–35)
BUN: 12 mg/dL (ref 6–23)
Basophils Absolute: 0 10*3/uL (ref 0.0–0.1)
Basophils Relative: 1 % (ref 0–1)
CO2: 23 meq/L (ref 19–32)
Calcium: 8.7 mg/dL (ref 8.4–10.5)
Creatinine, Ser: 0.76 mg/dL (ref 0.40–1.20)
Eosinophils Absolute: 0.2 10*3/uL (ref 0.0–0.7)
Eosinophils Relative: 7 % — ABNORMAL HIGH (ref 0–5)
HCT: 39.2 % (ref 36.0–46.0)
Lymphocytes Relative: 32 % (ref 12–46)
MCHC: 32.4 g/dL (ref 30.0–36.0)
Monocytes Absolute: 0.4 10*3/uL (ref 0.1–1.0)
Monocytes Relative: 13 % — ABNORMAL HIGH (ref 3–12)
Neutro Abs: 1.6 10*3/uL — ABNORMAL LOW (ref 1.7–7.7)
Neutrophils Relative %: 48 % (ref 43–77)
RBC: 4.17 M/uL (ref 3.87–5.11)
Sodium: 140 meq/L (ref 135–145)
WBC: 3.3 10*3/uL — ABNORMAL LOW (ref 4.0–10.5)

## 2009-08-04 ENCOUNTER — Ambulatory Visit: Payer: Self-pay | Admitting: Internal Medicine

## 2009-08-04 DIAGNOSIS — R1084 Generalized abdominal pain: Secondary | ICD-10-CM | POA: Insufficient documentation

## 2009-08-04 LAB — CONVERTED CEMR LAB
ALT: 9 units/L (ref 0–35)
AST: 16 units/L (ref 0–37)
Albumin: 4.5 g/dL (ref 3.5–5.2)
Alkaline Phosphatase: 33 units/L — ABNORMAL LOW (ref 39–117)
Basophils Relative: 1 % (ref 0–1)
Bilirubin Urine: NEGATIVE
Calcium: 9.1 mg/dL (ref 8.4–10.5)
Cholesterol: 138 mg/dL (ref 0–200)
Eosinophils Absolute: 0.2 10*3/uL (ref 0.0–0.7)
Glucose, Bld: 85 mg/dL (ref 70–99)
Glucose, Urine, Semiquant: NEGATIVE
HDL: 29 mg/dL — ABNORMAL LOW (ref >39)
Ketones, urine, test strip: NEGATIVE
LDL Cholesterol: 72 mg/dL (ref 0–99)
Lymphocytes Relative: 27 % (ref 12–46)
Monocytes Absolute: 0.8 10*3/uL (ref 0.1–1.0)
Monocytes Relative: 13 % — ABNORMAL HIGH (ref 3–12)
Neutro Abs: 3.4 10*3/uL (ref 1.7–7.7)
Neutrophils Relative %: 56 % (ref 43–77)
Platelets: 280 10*3/uL (ref 150–400)
RDW: 13 % (ref 11.5–15.5)
Sodium: 139 meq/L (ref 135–145)
Specific Gravity, Urine: 1.015
Total Bilirubin: 0.5 mg/dL (ref 0.3–1.2)
Total CHOL/HDL Ratio: 4.8

## 2009-08-05 ENCOUNTER — Encounter (INDEPENDENT_AMBULATORY_CARE_PROVIDER_SITE_OTHER): Payer: Self-pay | Admitting: Internal Medicine

## 2009-08-07 ENCOUNTER — Encounter (INDEPENDENT_AMBULATORY_CARE_PROVIDER_SITE_OTHER): Payer: Self-pay | Admitting: Internal Medicine

## 2009-08-28 ENCOUNTER — Ambulatory Visit: Payer: Self-pay | Admitting: Internal Medicine

## 2009-08-28 LAB — CONVERTED CEMR LAB
Ketones, urine, test strip: NEGATIVE
Nitrite: NEGATIVE
Specific Gravity, Urine: >=1.03
Urobilinogen, UA: 0.2
WBC Urine, dipstick: NEGATIVE
pH: 6

## 2009-09-06 ENCOUNTER — Telehealth (INDEPENDENT_AMBULATORY_CARE_PROVIDER_SITE_OTHER): Payer: Self-pay | Admitting: Internal Medicine

## 2009-09-06 ENCOUNTER — Encounter (INDEPENDENT_AMBULATORY_CARE_PROVIDER_SITE_OTHER): Payer: Self-pay | Admitting: Internal Medicine

## 2009-09-06 LAB — CONVERTED CEMR LAB: Pap Smear: NEGATIVE

## 2009-09-07 LAB — CONVERTED CEMR LAB
Albumin: 4.5 g/dL (ref 3.5–5.2)
Alkaline Phosphatase: 38 units/L — ABNORMAL LOW (ref 39–117)
Chloride: 104 meq/L (ref 96–112)
Creatinine, Ser: 0.71 mg/dL (ref 0.40–1.20)
Eosinophils Absolute: 0.2 10*3/uL (ref 0.0–0.7)
Glucose, Bld: 94 mg/dL (ref 70–99)
Hemoglobin: 14 g/dL (ref 12.0–15.0)
Lymphocytes Relative: 34 % (ref 12–46)
Lymphs Abs: 1.7 10*3/uL (ref 0.7–4.0)
MCHC: 32.8 g/dL (ref 30.0–36.0)
MCV: 95.7 fL (ref 78.0–100.0)
Neutrophils Relative %: 48 % (ref 43–77)
Potassium: 4.4 meq/L (ref 3.5–5.3)
Total Bilirubin: 0.8 mg/dL (ref 0.3–1.2)
Total Protein: 7.8 g/dL (ref 6.0–8.3)

## 2009-09-08 ENCOUNTER — Ambulatory Visit: Payer: Self-pay | Admitting: Internal Medicine

## 2009-09-08 LAB — CONVERTED CEMR LAB: Rhuematoid fact SerPl-aCnc: 161 intl units/mL — ABNORMAL HIGH (ref 0–20)

## 2009-10-05 ENCOUNTER — Encounter (INDEPENDENT_AMBULATORY_CARE_PROVIDER_SITE_OTHER): Payer: Self-pay | Admitting: Internal Medicine

## 2009-11-06 ENCOUNTER — Encounter (INDEPENDENT_AMBULATORY_CARE_PROVIDER_SITE_OTHER): Payer: Self-pay | Admitting: Internal Medicine

## 2009-11-27 ENCOUNTER — Ambulatory Visit: Payer: Self-pay | Admitting: Internal Medicine

## 2009-12-01 ENCOUNTER — Encounter (INDEPENDENT_AMBULATORY_CARE_PROVIDER_SITE_OTHER): Payer: Self-pay | Admitting: Internal Medicine

## 2009-12-01 LAB — CONVERTED CEMR LAB
ALT: 11 units/L (ref 0–35)
AST: 20 units/L (ref 0–37)
Albumin: 4.3 g/dL (ref 3.5–5.2)
Alkaline Phosphatase: 37 units/L — ABNORMAL LOW (ref 39–117)
BUN: 12 mg/dL (ref 6–23)
Basophils Relative: 1 % (ref 0–1)
CO2: 27 meq/L (ref 19–32)
Calcium: 9.4 mg/dL (ref 8.4–10.5)
Creatinine, Ser: 0.72 mg/dL (ref 0.40–1.20)
Eosinophils Relative: 5 % (ref 0–5)
Lymphocytes Relative: 32 % (ref 12–46)
MCHC: 31.9 g/dL (ref 30.0–36.0)
Monocytes Absolute: 0.7 10*3/uL (ref 0.1–1.0)
Neutrophils Relative %: 47 % (ref 43–77)
Platelets: 252 10*3/uL (ref 150–400)
RDW: 12.9 % (ref 11.5–15.5)
Total Protein: 6.9 g/dL (ref 6.0–8.3)

## 2010-01-11 ENCOUNTER — Telehealth (INDEPENDENT_AMBULATORY_CARE_PROVIDER_SITE_OTHER): Payer: Self-pay | Admitting: *Deleted

## 2010-01-11 ENCOUNTER — Ambulatory Visit: Payer: Self-pay | Admitting: Internal Medicine

## 2010-01-12 DIAGNOSIS — R7309 Other abnormal glucose: Secondary | ICD-10-CM | POA: Insufficient documentation

## 2010-01-12 LAB — CONVERTED CEMR LAB
ALT: 16 units/L (ref 0–35)
AST: 18 units/L (ref 0–37)
BUN: 11 mg/dL (ref 6–23)
Basophils Absolute: 0 10*3/uL (ref 0.0–0.1)
CO2: 23 meq/L (ref 19–32)
Calcium: 8.9 mg/dL (ref 8.4–10.5)
Creatinine, Ser: 0.82 mg/dL (ref 0.40–1.20)
Eosinophils Absolute: 0.3 10*3/uL (ref 0.0–0.7)
Eosinophils Relative: 6 % — ABNORMAL HIGH (ref 0–5)
HCT: 40.4 % (ref 36.0–46.0)
Lymphs Abs: 1.7 10*3/uL (ref 0.7–4.0)
MCV: 94.6 fL (ref 78.0–100.0)
Monocytes Absolute: 0.7 10*3/uL (ref 0.1–1.0)
Monocytes Relative: 12 % (ref 3–12)
Neutro Abs: 2.9 10*3/uL (ref 1.7–7.7)
Neutrophils Relative %: 51 % (ref 43–77)
Platelets: 279 10*3/uL (ref 150–400)
RDW: 13.1 % (ref 11.5–15.5)
Sodium: 138 meq/L (ref 135–145)
WBC: 5.6 10*3/uL (ref 4.0–10.5)

## 2010-01-27 ENCOUNTER — Telehealth (INDEPENDENT_AMBULATORY_CARE_PROVIDER_SITE_OTHER): Payer: Self-pay | Admitting: Internal Medicine

## 2010-01-27 ENCOUNTER — Ambulatory Visit: Payer: Self-pay | Admitting: Internal Medicine

## 2010-01-27 DIAGNOSIS — R300 Dysuria: Secondary | ICD-10-CM

## 2010-01-27 LAB — CONVERTED CEMR LAB
Bilirubin Urine: NEGATIVE
Nitrite: NEGATIVE

## 2010-01-28 ENCOUNTER — Encounter (INDEPENDENT_AMBULATORY_CARE_PROVIDER_SITE_OTHER): Payer: Self-pay | Admitting: Internal Medicine

## 2010-03-29 ENCOUNTER — Ambulatory Visit: Payer: Self-pay | Admitting: Internal Medicine

## 2010-04-03 LAB — CONVERTED CEMR LAB
HDL: 29 mg/dL — ABNORMAL LOW (ref >39)
Triglycerides: 323 mg/dL — ABNORMAL HIGH (ref <150)
VLDL: 65 mg/dL — ABNORMAL HIGH (ref 0–40)

## 2010-04-27 ENCOUNTER — Ambulatory Visit: Payer: Self-pay | Admitting: Nurse Practitioner

## 2010-04-27 ENCOUNTER — Encounter (INDEPENDENT_AMBULATORY_CARE_PROVIDER_SITE_OTHER): Payer: Self-pay | Admitting: Internal Medicine

## 2010-05-06 ENCOUNTER — Ambulatory Visit: Payer: Self-pay | Admitting: Internal Medicine

## 2010-05-10 ENCOUNTER — Ambulatory Visit: Payer: Self-pay | Admitting: Internal Medicine

## 2010-05-10 ENCOUNTER — Encounter (INDEPENDENT_AMBULATORY_CARE_PROVIDER_SITE_OTHER): Payer: Self-pay | Admitting: Internal Medicine

## 2010-05-23 ENCOUNTER — Encounter (INDEPENDENT_AMBULATORY_CARE_PROVIDER_SITE_OTHER): Payer: Self-pay | Admitting: Internal Medicine

## 2010-06-22 LAB — CONVERTED CEMR LAB
HDL: 32 mg/dL — ABNORMAL LOW (ref >39)
Total CHOL/HDL Ratio: 4.2
VLDL: 43 mg/dL — ABNORMAL HIGH (ref 0–40)

## 2010-06-29 NOTE — Miscellaneous (Signed)
Summary: Urology microscopic hematuria eval doc.  Dr. Vernie Ammons  Clinical Lists Changes  Problems: Changed problem from MICROSCOPIC HEMATURIA (ICD-599.72) to MICROSCOPIC HEMATURIA (BMW-413.24) - Evaluated 08/07/2008 by Dr. Vernie Ammons, Urology.  work up:  renal ultrasound:  Normal.  URine culture negative, CMEt, CBC normal, urine cytology revealed some atypical cells, but cystoscopy was normal.  Abnormal cells felt secondary to probable bladder inflammation with what was felt to be cystitis at the time of testing. Orders: Added new Referral order of Nephrology Referral (Nephro) - Signed

## 2010-06-29 NOTE — Assessment & Plan Note (Signed)
Summary: CPP EXAM//GK   Vital Signs:  Patient profile:   40 year old female Menstrual status:  regular LMP:     07/14/2009 Weight:      174.6 pounds Temp:     97.9 degrees F Pulse rate:   87 / minute Pulse rhythm:   regular Resp:     16 per minute BP sitting:   113 / 69  (left arm) Cuff size:   regular  Vitals Entered By: Vesta Mixer CMA (August 04, 2009 11:42 AM) CC: CPP Is Patient Diabetic? No Pain Assessment Patient in pain? no       Does patient need assistance? Ambulation Normal LMP (date): 07/14/2009 LMP - Character: normal LMP - Reliable? Yes Menarche (age onset years): 13    Menstrual flow (days): 3 On BCP's at conception: yes Enter LMP: 07/14/2009 Last PAP Result NEGATIVE FOR INTRAEPITHELIAL LESIONS OR MALIGNANCY.   Primary Care Provider:  Julieanne Manson MD  CC:  CPP.  History of Present Illness: 40 yo female here for CPP.  Concerns:  1.  Recurrent microscopic hematuria.  Pt. did have a urologic evaluation in past, but have not seen the records regarding that--had cystocopy apparently.  Did note yesterday that those records are in and have not fully evaluated yet.  Discussed may need nephrology referral.  2.  Rheumatoid Arthritis:  following with Dr. Mechele Dawley at [facility].  WBC level was low (3.3) with last check in [facility] recheck it was apparently fine.  Pt. does have an appt. for follow up labs already scheduled.  She was told to follow upas she has been doing.  Pt. on hydroxychloroquine.  Has not had an eye check this year.    Habits & Providers  Alcohol-Tobacco-Diet     Alcohol drinks/day: 0     Tobacco Status: never  Exercise-Depression-Behavior     Drug Use: never  Allergies (verified): No Known Drug Allergies  Past History:  Past Surgical History: 1.  Bilateral Tubal ligation  Family History: Reviewed history from 05/22/2007 and no changes required. Mother 50:  stroke at age 52 Father 32:  healthy 3 brothers:  healthy 1  sister:  healthy 2 children  87 and 49:  26 yo daughter with MR.  Social History: Reviewed history from 05/27/2008 and no changes required. Lives at home with husband and 2 children Housewife; also works as Orthoptist person Originally from [place] been in [employer]. 15 years.Drug Use:  never  Review of Systems General:  Energy--a bit low. Eyes:  Denies blurring; No peripheral or color vision problems. ENT:  Denies decreased hearing. CV:  Denies chest pain or discomfort and palpitations. Resp:  Complains of shortness of breath; Sometimes at night when lies down to sleep.  Lasts about 10 minutes.Marland Kitchen GI:  Complains of abdominal pain; Suprapubic disomfort.  Like sharp pain in the area.  Can last on and off all day.  No dysuria.  Has not noted any gross hematuria.  Does have urinary frequency.  Does not feel like she is emptying fully. Pt.  also with constipation.  --started 2 days ago with LUQ pain.  Cannot describe pain, but is mild and intermittent.  Better if passes stool, which is a bit hard.  Normally not constipated.  Does not drink much in way of water.  Does not eat much in way of fruits and veggies.  . GU:  No vaginal discharge, just irritation.  Not on any antibiotics recently.  No odor.. MS:  Denies joint pain, joint  redness, and joint swelling. Derm:  Denies lesion(s) and rash. Neuro:  Denies numbness, tingling, and weakness. Psych:  Denies anxiety, depression, and suicidal thoughts/plans.  Physical Exam  General:  Well-developed,well-nourished,in no acute distress; alert,appropriate and cooperative throughout examination Head:  Normocephalic and atraumatic without obvious abnormalities. No apparent alopecia or balding. Eyes:  No corneal or conjunctival inflammation noted. EOMI. Perrla. Funduscopic exam benign, without hemorrhages, exudates or papilledema. Vision grossly normal. Ears:  External ear exam shows no significant lesions or deformities.  Otoscopic examination reveals  clear canals, tympanic membranes are intact bilaterally without bulging, retraction, inflammation or discharge. Hearing is grossly normal bilaterally. Nose:  External nasal examination shows no deformity or inflammation. Nasal mucosa are pink and moist without lesions or exudates. Mouth:  Oral mucosa and oropharynx without lesions or exudates.  Teeth in good repair. Neck:  No deformities, masses, or tenderness noted. Breasts:  No mass, nodules, thickening, tenderness, bulging, retraction, inflamation, nipple discharge or skin changes noted.   Lungs:  Normal respiratory effort, chest expands symmetrically. Lungs are clear to auscultation, no crackles or wheezes. Heart:  Normal rate and regular rhythm. S1 and S2 normal without gallop, murmur, click, rub or other extra sounds. Abdomen:  Entire abdomen with mild tenderness.  No HSM or mass.  BS + throughout.  Suprapubic area mildly tender as well,  but no worse than rest of abdomen.  No rebound or peritoneal signs Genitalia:  Pelvic Exam:        External: normal female genitalia without lesions or masses        Vagina: normal without lesions or masses.  Scant thin white discharge.  Minimal inflammation of canal.        Cervix: normal without lesions or masses        Adnexa: normal bimanual exam without masses or fullness        Uterus: normal by palpation        Pap smear: performed Msk:  No deformity or scoliosis noted of thoracic or lumbar spine.   Pulses:  R and L carotid,radial,femoral,dorsalis pedis and posterior tibial pulses are full and equal bilaterally Extremities:  No clubbing, cyanosis, edema, or deformity noted with normal full range of motion of all joints.   Neurologic:  No cranial nerve deficits noted. Station and gait are normal. Plantar reflexes are down-going bilaterally. DTRs are symmetrical throughout. Sensory, motor and coordinative functions appear intact. Skin:  Intact without suspicious lesions or rashes Cervical Nodes:  No  lymphadenopathy noted Axillary Nodes:  No palpable lymphadenopathy Inguinal Nodes:  No significant adenopathy Psych:  Cognition and judgment appear intact. Alert and cooperative with normal attention span and concentration. No apparent delusions, illusions, hallucinations   Impression & Recommendations:  Problem # 1:  ROUTINE GYNECOLOGICAL EXAMINATION (ICD-V72.31)  Orders: KOH/ WET Mount [phone]) Pap Smear, Thin Prep ( Collection of) [phone]) T- GC Chlamydia ([address]782) T-HIV Antibody  (Reflex) [id number]) T-Pap Smear, Thin Prep (78469) T-Syphilis Test (RPR) [phone])  Problem # 2:  ABDOMINAL PAIN, GENERALIZED (ICD-789.07) May just be constipation--discussed better diet  Orders: T-Comprehensive Metabolic Panel ([id number]) T-CBC w/Diff ([id number])  Problem # 3:  MICROSCOPIC HEMATURIA (ICD-599.72) With suprapubic pain and frequency Need to look at Urology notes--may need to see nephrology, though creatinine had been fine Orders: T-Culture, Urine ([id number])  Problem # 4:  DYSLIPIDEMIA (ICD-272.4)  Orders: T-Lipid Profile ([id number])  Problem # 5:  VAGINITIS, CANDIDAL (ICD-112.1)  Her updated medication list for this problem includes:    Fluconazole 150 Mg Tabs (Fluconazole) .Marland KitchenMarland KitchenMarland KitchenMarland Kitchen  1 tab by mouth daily for 2 days  Complete Medication List: 1)  Ferrous Sulfate 325 (65 Fe) Mg Tabs (Ferrous sulfate) .... Tomar una tableta diaria (take one tablet once a day)  generic for ferrous sulf 325mg  2)  Folic Acid 1 Mg Tabs (Folic acid) .... Tomar 1 tableta diaria (take one tablet every day) 3)  Humira 40 Mg/0.24ml Kit (Adalimumab) .... Inject 40 mg subcutaneously once every other week 4)  Hydroxychloroquine Sulfate 200 Mg Tabs (Hydroxychloroquine sulfate) .... Tomar 1 tableta dos veces al dia. (take one tablet twice daily) 5)  Methotrexate 2.5 Mg Tabs (Methotrexate sodium) .... Toma ocho tabletas cada domingo (take 8 tablets every sunday) 6)  Protonix 40 Mg Tbec (Pantoprazole  sodium) .... Tomar una tableta diaria (take one tablet once a day) 7)  Xyzal 5 Mg Tabs (Levocetirizine dihydrochloride) .... 1 tab by mouth daily as needed allergies 8)  Nasacort Aq 55 Mcg/act Aers (Triamcinolone acetonide(nasal)) .... 1 spray in each nostril two times a day 9)  Optivar 0.05 % Soln (Azelastine hcl) .... 1 drop in both eyes two times a day 10)  Fluconazole 150 Mg Tabs (Fluconazole) .... 1 tab by mouth daily for 2 days Prescriptions: XYZAL 5 MG TABS (LEVOCETIRIZINE DIHYDROCHLORIDE) 1 tab by mouth daily as needed allergies  #30 x 11   Entered and Authorized by:   Lemoine Goyne MD   Signed by:   Rhilynn Preyer MD on 08/04/2009   Method used:   Faxed to ...       [facility])       [address]       [place], [place]  [address]       Ph: [phone]       Fax: [phone]   RxID:   [id number] NASACORT AQ 55 MCG/ACT AERS (TRIAMCINOLONE ACETONIDE(NASAL)) 1 spray in each nostril two times a day  #1 x 11   Entered and Authorized by:   Aquinnah Devin MD   Signed by:   Jensen Cheramie MD on 08/04/2009   Method used:   Faxed to ...       [facility])       [address]       [place], [place]  [address]       Ph: [phone]       Fax: [phone]   RxID:   [id number] PROTONIX 40 MG TBEC (PANTOPRAZOLE SODIUM) TOMAR UNA TABLETA DIARIA (TAKE ONE TABLET ONCE A DAY)  #30 x 11   Entered and Authorized by:   Mitzy Naron MD   Signed by:   Clarnce Homan MD on 08/04/2009   Method used:   Faxed to ...       [facility])       [address]       [place], [place]  [address]       Ph: [phone]       Fax: [phone]   RxID:   [id number] METHOTREXATE 2.5 MG TABS (METHOTREXATE SODIUM) TOMA OCHO TABLETAS CADA DOMINGO (TAKE 8 TABLETS EVERY SUNDAY)  #32 x 2   Entered and Authorized by:   Nijel Flink MD   Signed by:   Romero Letizia MD on 08/04/2009   Method used:   Faxed to ...       [facility] ([facility]ail)       [address]  [place], [place]  [address]       Ph: [phone]       Fax: [phone]   RxID:   [id number] HYDROXYCHLOROQUINE SULFATE 200 MG TABS (HYDROXYCHLOROQUINE SULFATE) TOMAR 1 TABLETA DOS VECES AL DIA. (TAKE ONE TABLET TWICE DAILY)  #60 x 6   Entered and Authorized by:   Julieanne Manson MD   Signed by:   Julieanne Manson MD on 08/04/2009   Method used:   Faxed to ...       [facility] (retail)       [address] [place], [place]  [address]       Ph: [phone]       Fax: [phone]   RxID:   [id number] FOLIC ACID 1 MG TABS (FOLIC ACID) TOMAR 1 TABLETA DIARIA (TAKE ONE TABLET EVERY DAY)  #30 x 11   Entered and Authorized by:   Julieanne Manson MD   Signed by:   Julieanne Manson MD on 08/04/2009   Method used:   Faxed to ...       [facility] (retail)       [address] [place], [place]  [address]       Ph: [phone]       Fax: [phone]   RxID:   [id number] FERROUS SULFATE 325 (65 FE) MG TABS (FERROUS SULFATE) TOMAR UNA TABLETA DIARIA (TAKE ONE TABLET ONCE A DAY)  Generic for FERROUS SULF 325MG   #30 x 11   Entered and Authorized by:   Julieanne Manson MD   Signed by:   Julieanne Manson MD on 08/04/2009   Method used:   Faxed to ...       [facility] (retail)       [address] [place], [place]  [address]       Ph: [phone]       Fax: [phone]   RxID:   [id number] FLUCONAZOLE 150 MG TABS (FLUCONAZOLE) 1 tab by mouth daily for 2 days  #2 x 0   Entered and Authorized by:   Julieanne Manson MD   Signed by:   Julieanne Manson MD on 08/04/2009   Method used:   Faxed to ...       [facility] (retail)       [address] [place], [place]  [address]       Ph: [phone]       Fax: [phone]   RxID:   [id number]    Preventive Care Screening  Prior Values:    PPD:  negative (08/13/2007)    Pap Smear:  NEGATIVE FOR INTRAEPITHELIAL LESIONS OR MALIGNANCY. (05/27/2008)    Last Tetanus Booster:  Historical (10/25/2005)     SBE:  no changed--does perform regularly.   Osteoprevention:  3 glasses of milk daily.  Not much in way of exercise.   Immunizations:   Did have flu shot at [facility].  Laboratory Results   Urine Tests    Routine Urinalysis   Glucose: negative   (Normal Range: Negative) Bilirubin: negative   (Normal Range: Negative) Ketone: negative   (Normal Range: Negative) Spec. Gravity: 1.015   (Normal Range: 1.003-1.035) Blood: moderate   (Normal Range: Negative) pH: 7.0   (Normal Range: 5.0-8.0) Protein: trace   (Normal Range:  Negative) Urobilinogen: 0.2   (Normal Range: 0-1) Nitrite: negative   (Normal Range: Negative) Leukocyte Esterace: negative   (Normal Range: Negative)      Wet Mount Source: vaginal WBC/hpf: 5-10 Bacteria/hpf: 1+ Clue cells/hpf: none  Negative whiff Yeast/hpf: few Wet Mount KOH: Negative Trichomonas/hpf: none     Tetanus/Td Immunization History:    Tetanus/Td # 1:  Historical (10/25/2005)  Influenza Immunization History:    Influenza # 1:  Historical (02/27/2009)  Pneumovax Immunization History:    Pneumovax # 1:  Historical (04/25/2005)  Other Immunization History:    H1N1 # 1 (05/27/2008)   Laboratory Results   Urine Tests    Routine Urinalysis   Glucose: negative   (Normal Range: Negative) Bilirubin: negative   (Normal Range: Negative) Ketone: negative   (Normal Range: Negative) Spec. Gravity: 1.015   (Normal Range: 1.003-1.035) Blood: moderate   (Normal Range: Negative) pH: 7.0   (Normal Range: 5.0-8.0) Protein: trace   (Normal Range:  Negative) Urobilinogen: 0.2   (Normal Range: 0-1) Nitrite: negative   (Normal Range: Negative) Leukocyte Esterace: negative   (Normal Range: Negative)      Wet Mount/KOH  Negative whiff   Appended Document: CPP EXAM//GK  Laboratory Results    Other Tests  Rapid HIV: negative

## 2010-06-29 NOTE — Progress Notes (Signed)
Summary: Office Visit//DEPRESSION SCREENNING  Office Visit//DEPRESSION SCREENNING   Imported By: Arta Bruce 10/05/2009 14:59:21  _____________________________________________________________________  External Attachment:    Type:   Image     Comment:   External Document

## 2010-06-29 NOTE — Letter (Signed)
Summary: *HSN Results Follow up  [facility]  [address] [place], [place] [address]   Phone: [phone]  Fax: [phone]      12/01/2009   FAYETTA SORENSON [address] [place], [place]  [address]   Dear  [name],                            ____S.Drinkard,FNP   ____D. Gore,FNP       ____B. McPherson,MD   ____V. Rankins,MD    __X__E. Koree Staheli,MD    ____N. Daphine Deutscher, FNP  ____D. Reche Dixon, MD    ____K. Philipp Deputy, MD    ____Other     This letter is to inform you that your recent test(s):  _______Pap Smear    __X_____Lab Test     _______X-ray    ___X____ is within acceptable limits  _______ requires a medication change  _______ requires a follow-up lab visit  _______ requires a follow-up visit with your provider   Comments:  Faxed to Dr. Mechele Dawley       _________________________________________________________ If you have any questions, please contact our office                     Sincerely,  Julieanne Manson MD [facility]

## 2010-06-29 NOTE — Letter (Signed)
Summary: *Referral Letter  [facility]  [address] [place], [place] [address]   Phone: [phone]  Fax: [phone]    09/06/2009  Thank you in advance for agreeing to see my patient:  Haley Norton [address] [place], [place]  [address]  Phone: [phone]  Reason for Referral: Persistent micorhematuria. Pt. with RA, treated by Dr. Mechele Dawley at [facility].  When initially noted end of 2009, beginning 2010, she at times had symptoms consistent with possible cystitis, though urine cultures were often time quite low count or negative.  Nevertheless, she intially became asymptomatic with antibiotic treatment.  I believe she did have one follow up with no microhematuria.  As the hematuria ultimately persisted, Ms. Altamirano underwent renal ultrasound, repeat CBC, CMET with unremarkable findings and normal creatinine.  She was seen by Dr. Vernie Ammons, Urology on 08/07/2008.  Urine cytology did show some atypical cells, but repeat urine culture and cystoscopy were negative and normal respectively.  She continues to have persistent microscopic hematuria, however, that seems asymptmatic now.  I am concerned she may have a primary renal concern related to her autoimmune disease.  Her creatinine remains normal as does her CBC.  I have ordered an ANA, sed rate, and RF as I believe those have not been obtained for some time.  Those tests are pending.  Procedures Requested: Evaluation for renal disease with persistent microscopic hematuria  Current Medical Problems: 1)  VAGINITIS, CANDIDAL (ICD-112.1) 2)  ABDOMINAL PAIN, GENERALIZED (ICD-789.07) 3)  SKIN LESION, UNCERTAIN NATURE (ICD-709.9) 4)  VAGINITIS, CANDIDAL (ICD-112.1) 5)  PELVIC  PAIN (ICD-789.09) 6)  FLANK PAIN, RIGHT (ICD-789.09) 7)  MICROSCOPIC HEMATURIA (ICD-599.72) 8)  SUPRAPUBIC PAIN (ICD-789.09) 9)  ROUTINE GYNECOLOGICAL EXAMINATION (ICD-V72.31) 10)  PAIN IN JOINT PELVIC REGION AND THIGH (ICD-719.45) 11)  LOW BACK  PAIN, CHRONIC (ICD-724.2) 12)  ACUTE CYSTITIS (ICD-595.0) 13)  ALLERGIC RHINITIS, SEASONAL (ICD-477.0) 14)  HEADACHE (ICD-784.0) 15)  CORNS AND CALLUSES (ICD-700) 16)  DYSLIPIDEMIA (ICD-272.4) 17)  DENTAL PAIN (ICD-525.9) 18)  ENCOUNTER FOR LONG-TERM USE OF OTHER MEDICATIONS (ICD-V58.69) 19)  HEALTH MAINTENANCE EXAM (ICD-V70.0) 20)  NASOPHARYNGITIS (ICD-460) 21)  ARTHRITIS, RHEUMATOID (ICD-714.0)   Current Medications: 1)  FERROUS SULFATE 325 (65 FE) MG TABS (FERROUS SULFATE) TOMAR UNA TABLETA DIARIA (TAKE ONE TABLET ONCE A DAY)  Generic for FERROUS SULF 325MG  2)  FOLIC ACID 1 MG TABS (FOLIC ACID) TOMAR 1 TABLETA DIARIA (TAKE ONE TABLET EVERY DAY) 3)  HUMIRA 40 MG/0.8ML KIT (ADALIMUMAB) INJECT 40 MG SUBCUTANEOUSLY ONCE every other week 4)  HYDROXYCHLOROQUINE SULFATE 200 MG TABS (HYDROXYCHLOROQUINE SULFATE) TOMAR 1 TABLETA DOS VECES AL DIA. (TAKE ONE TABLET TWICE DAILY) 5)  METHOTREXATE 2.5 MG TABS (METHOTREXATE SODIUM) TOMA OCHO TABLETAS CADA DOMINGO (TAKE 8 TABLETS EVERY SUNDAY) 6)  PROTONIX 40 MG TBEC (PANTOPRAZOLE SODIUM) TOMAR UNA TABLETA DIARIA (TAKE ONE TABLET ONCE A DAY) 7)  XYZAL 5 MG TABS (LEVOCETIRIZINE DIHYDROCHLORIDE) 1 tab by mouth daily as needed allergies 8)  NASACORT AQ 55 MCG/ACT AERS (TRIAMCINOLONE ACETONIDE(NASAL)) 1 spray in each nostril two times a day 9)  OPTIVAR 0.05 % SOLN (AZELASTINE HCL) 1 drop in both eyes two times a day 10)  FLUCONAZOLE 150 MG TABS (FLUCONAZOLE) 1 tab by mouth daily for 2 days   Past Medical History: 1)  Rheumatoid Arthritis;on multiple agents.Followed at [facility] by Rheum,Dr.O'Rourke 2)  DYSLIPIDEMIA (ICD-272) 3)  ENCOUNTER FOR LONG-TERM USE OF OTHER MEDICATIONS (ICD-V58 4)  ARTHRITIS, RHEUMATOID (ICD-714.0)  Thank you again for agreeing to see our patient; please contact  us if you have any further questions or need additional information.  Sincerely,  Julieanne Manson MD

## 2010-06-29 NOTE — Letter (Signed)
Summary: [facility]  [facility]   Imported By: Arta Bruce 04/05/2010 16:37:08  _____________________________________________________________________  External Attachment:    Type:   Image     Comment:   External Document

## 2010-06-29 NOTE — Progress Notes (Signed)
  Phone Note Outgoing Call   Summary of Call: Debra:  hold on sending this referral for [facility] until after ANA, Sed rated and RF back --then fax those results with referral.  Also, send last year of CMET and last CBC.  Thanks Initial call taken by: Julieanne Manson MD,  September 06, 2009 3:59 PM

## 2010-06-29 NOTE — Progress Notes (Signed)
Summary: ACUTE   Phone Note Call from Patient   Reason for Call: Acute Illness Complaint: Nausea/Vomiting/Diarrhea Summary of Call: PT DR Karrin Eisenmenger  IS HAVING BURNING WHEN URINATED,NAUSEA ,CHILLS SINCE 4 DAYS AGO. (SPANISH PT) Initial call taken by: Cheryll Dessert,  January 27, 2010 8:26 AM  Follow-up for Phone Call        ????/sent to provider Follow-up by: Michelle Nasuti,  January 27, 2010 8:29 AM  Additional Follow-up for Phone Call Additional follow up Details #1::        Have her come in today and give urine specimen with triage nurse--please leave this for Aggie Cosier to see.  Additional Follow-up by: Julieanne Manson MD,  January 27, 2010 8:45 AM    Additional Follow-up for Phone Call Additional follow up Details #2::    Pt. in office.  Dutch Quint RN  January 27, 2010 10:26 AM

## 2010-06-29 NOTE — Letter (Signed)
Summary: [facility]  [facility]   Imported By: Arta Bruce 11/19/2009 10:12:54  _____________________________________________________________________  External Attachment:    Type:   Image     Comment:   External Document

## 2010-06-29 NOTE — Progress Notes (Signed)
Summary: MEDS REFILL  Phone Note Refill Request   Refills Requested: Medication #1:  HUMIRA 40 MG/0.8ML KIT INJECT 40 MG SUBCUTANEOUSLY ONCE every other week  Medication #2:  METHOTREXATE 2.5 MG TABS TOMA OCHO TABLETAS CADA DOMINGO (TAKE 8 TABLETS EVERY "SUNDAY) Initial call taken by: Nora Soler,  January 11, 2010 8:36 AM  Follow-up for Phone Call        Fwd to Dr. Mulberry for refill auth. Follow-up by: Tiffany McCoy CMA,  January 11, 2010 11:19 AM  Additional Follow-up for Phone Call Additional follow up Details #1::        These meds really should be prescribed by her rheumatologist.  I will fill Methotrexate for 2 more months, but she should be contacting Rheumatology for the Humira and also to get her Methotrexate through him. When is her next appt. with Dr. O'Roarke? Additional Follow-up by: Elizabeth Mulberry MD,  January 12, 2010 9:30 AM    Additional Follow-up for Phone Call Additional follow up Details #2::    PT HAVE AN APPT WITH THE RHEUMATOLOGY NOVEMBER PT DON'T REMEMBER THE DAY .  Follow-up by: Nora Soler,  January 13, 2010 9:31 AM  Prescriptions: METHOTREXATE 2.5 MG TABS (METHOTREXATE SODIUM) TOMA OCHO TABLETAS CADA DOMINGO (TAKE 8 TABLETS EVERY SUNDAY)  #32 x 2   Entered and Authorized by:   Elizabeth Mulberry MD   Signed by:   Elizabeth Mulberry MD on 01/12/2010   Method used:   Faxed to ...       [facility]ail[facility]       [address] [place], [place]  [address]       Ph: [phone]       Fax: [phone]   RxID:   [id number]

## 2010-06-29 NOTE — Letter (Signed)
Summary: [facility]   Imported By: Arta Bruce 10/08/2009 09:47:41  _____________________________________________________________________  External Attachment:    Type:   Image     Comment:   External Document

## 2010-06-29 NOTE — Letter (Signed)
Summary: REQUESTING RECORDS FROM [facility]  REQUESTING RECORDS FROM [facility]   Imported By: Arta Bruce 07/06/2009 16:29:58  _____________________________________________________________________  External Attachment:    Type:   Image     Comment:   External Document

## 2010-06-29 NOTE — Letter (Signed)
Summary: Lipid Letter  [facility]  [address] [place], [place] [address]   Phone: [phone]  Fax: [phone]    09/06/2009  Haley Norton [address] [place], [place]  [address]  Dear Haley Norton:  We have carefully reviewed your last lipid profile from 08/04/2009 and the results are noted below with a summary of recommendations for lipid management.    Cholesterol:       138     Goal: <200   HDL "good" Cholesterol:   29     Goal: >45   LDL "bad" Cholesterol:   72     Goal: <100   Triglycerides:       183     Goal: <150    You still have some blood in urine, but culture did not show an infection.  Need to have you come in for more blood work and will send you to another kidney specialist.  Elmarie Shiley will be calling. Faxed your latest normal blood work to Dr. Toma Aran Diet (Therapeutic Lifestyle Change): Saturated Fats & Transfatty acids should be kept < 7% of total calories ***Reduce Saturated Fats Polyunstaurated Fat can be up to 10% of total calories Monounsaturated Fat Fat can be up to 20% of total calories Total Fat should be no greater than 25-35% of total calories Carbohydrates should be 50-60% of total calories Protein should be approximately 15% of total calories Fiber should be at least 20-30 grams a day ***Increased fiber may help lower LDL Total Cholesterol should be < 200mg /day Consider adding plant stanol/sterols to diet (example: Benacol spread) ***A higher intake of unsaturated fat may reduce Triglycerides and Increase HDL    Adjunctive Measures (may lower LIPIDS and reduce risk of Heart Attack) include: Aerobic Exercise (20-30 minutes 3-4 times a week) Limit Alcohol Consumption Weight Reduction Aspirin 75-81 mg a day by mouth (if not allergic or contraindicated) Dietary Fiber 20-30 grams a day by mouth     Current Medications: 1)    Ferrous Sulfate 325 (65 Fe) Mg Tabs (Ferrous sulfate) .... Tomar una tableta diaria (take one  tablet once a day)  generic for ferrous sulf 325mg  2)    Folic Acid 1 Mg Tabs (Folic acid) .... Tomar 1 tableta diaria (take one tablet every day) 3)    Humira 40 Mg/0.74ml Kit (Adalimumab) .... Inject 40 mg subcutaneously once every other week 4)    Hydroxychloroquine Sulfate 200 Mg Tabs (Hydroxychloroquine sulfate) .... Tomar 1 tableta dos veces al dia. (take one tablet twice daily) 5)    Methotrexate 2.5 Mg Tabs (Methotrexate sodium) .... Toma ocho tabletas cada domingo (take 8 tablets every sunday) 6)    Protonix 40 Mg Tbec (Pantoprazole sodium) .... Tomar una tableta diaria (take one tablet once a day) 7)    Xyzal 5 Mg Tabs (Levocetirizine dihydrochloride) .Marland Kitchen.. 1 tab by mouth daily as needed allergies 8)    Nasacort Aq 55 Mcg/act Aers (Triamcinolone acetonide(nasal)) .Marland Kitchen.. 1 spray in each nostril two times a day 9)    Optivar 0.05 % Soln (Azelastine hcl) .Marland Kitchen.. 1 drop in both eyes two times a day 10)    Fluconazole 150 Mg Tabs (Fluconazole) .Marland Kitchen.. 1 tab by mouth daily for 2 days  If you have any questions, please call. We appreciate being able to work with you.   Sincerely,    [facility] Julieanne Manson MD

## 2010-06-29 NOTE — Miscellaneous (Signed)
  Clinical Lists Changes  Medications: Changed medication from HUMIRA 40 MG/0.8ML KIT (ADALIMUMAB) INJECT 40 MG SUBCUTANEOUSLY ONCE EACH WEEK. to HUMIRA 40 MG/0.8ML KIT (ADALIMUMAB) INJECT 40 MG SUBCUTANEOUSLY ONCE every other week

## 2010-06-29 NOTE — Letter (Signed)
Summary: [facility]   Imported By: Arta Bruce 11/06/2009 15:17:40  _____________________________________________________________________  External Attachment:    Type:   Image     Comment:   External Document

## 2010-06-29 NOTE — Letter (Signed)
Summary: [facility]  [facility]   Imported By: Arta Bruce 08/31/2009 12:21:30  _____________________________________________________________________  External Attachment:    Type:   Image     Comment:   External Document

## 2010-07-01 ENCOUNTER — Telehealth (INDEPENDENT_AMBULATORY_CARE_PROVIDER_SITE_OTHER): Payer: Self-pay | Admitting: Internal Medicine

## 2010-07-01 NOTE — Letter (Signed)
Summary: Lipid Letter  [facility]  [address] [place], [place] [address]   Phone: [phone]  Fax: [phone]    05/23/2010  Haley Norton [address] [place], [place]  [address]  Dear Haley Norton:  We have carefully reviewed your last lipid profile from 05/10/2010 and the results are noted below with a summary of recommendations for lipid management.    Cholesterol:       135     Goal: <200   HDL "good" Cholesterol:   32     Goal: >45   LDL "bad" Cholesterol:   60     Goal: <100   Triglycerides:       215     Goal: <150    Your triglycerides and HDL are both a bit better, but still not quite at goal.  Not sure why this was done so soon--had asked to repeat 6 months after your last one in October.  Your results do show you are on the right track, however.    [facility] Diet (Therapeutic Lifestyle Change): Saturated Fats & Transfatty acids should be kept < 7% of total calories ***Reduce Saturated Fats Polyunstaurated Fat can be up to 10% of total calories Monounsaturated Fat Fat can be up to 20% of total calories Total Fat should be no greater than 25-35% of total calories Carbohydrates should be 50-60% of total calories Protein should be approximately 15% of total calories Fiber should be at least 20-30 grams a day ***Increased fiber may help lower LDL Total Cholesterol should be < 200mg /day Consider adding plant stanol/sterols to diet (example: Benacol spread) ***A higher intake of unsaturated fat may reduce Triglycerides and Increase HDL    Adjunctive Measures (may lower LIPIDS and reduce risk of Heart Attack) include: Aerobic Exercise (20-30 minutes 3-4 times a week) Limit Alcohol Consumption Weight Reduction Aspirin 75-81 mg a day by mouth (if not allergic or contraindicated) Dietary Fiber 20-30 grams a day by mouth     Current Medications: 1)    Ferrous Sulfate 325 (65 Fe) Mg Tabs (Ferrous sulfate) .... Tomar una tableta diaria  (take one tablet once a day)  generic for ferrous sulf 325mg  2)    Folic Acid 1 Mg Tabs (Folic acid) .... Tomar 1 tableta diaria (take one tablet every day) 3)    Humira 40 Mg/0.48ml Kit (Adalimumab) .... Inject 40 mg subcutaneously once every other week 4)    Hydroxychloroquine Sulfate 200 Mg Tabs (Hydroxychloroquine sulfate) .... Tomar 1 tableta dos veces al dia. (take one tablet twice daily) 5)    Methotrexate 2.5 Mg Tabs (Methotrexate sodium) .... Toma ocho tabletas cada domingo (take 8 tablets every sunday) 6)    Protonix 40 Mg Tbec (Pantoprazole sodium) .... Tomar una tableta diaria (take one tablet once a day) 7)    Xyzal 5 Mg Tabs (Levocetirizine dihydrochloride) .Marland Kitchen.. 1 tab by mouth daily as needed allergies 8)    Nasacort Aq 55 Mcg/act Aers (Triamcinolone acetonide(nasal)) .Marland Kitchen.. 1 spray in each nostril two times a day 9)    Optivar 0.05 % Soln (Azelastine hcl) .Marland Kitchen.. 1 drop in both eyes two times a day 10)    Fluconazole 150 Mg Tabs (Fluconazole) .Marland Kitchen.. 1 tab by mouth daily for 2 days 11)    Ciprofloxacin Hcl 250 Mg Tabs (Ciprofloxacin hcl) .Marland Kitchen.. 1 tab by mouth two times a day for three days.  If you have any questions, please call. We appreciate being able to work with you.  Sincerely,    [facility] Julieanne Manson MD

## 2010-07-01 NOTE — Letter (Signed)
Summary: NUTRITION SUMMARY//[facility]  NUTRITION SUMMARY//[facility]   Imported By: Arta Bruce 06/01/2010 16:28:21  _____________________________________________________________________  External Attachment:    Type:   Image     Comment:   External Document

## 2010-07-05 ENCOUNTER — Encounter (INDEPENDENT_AMBULATORY_CARE_PROVIDER_SITE_OTHER): Payer: Self-pay | Admitting: Internal Medicine

## 2010-07-06 ENCOUNTER — Encounter (INDEPENDENT_AMBULATORY_CARE_PROVIDER_SITE_OTHER): Payer: Self-pay | Admitting: Internal Medicine

## 2010-07-06 LAB — CONVERTED CEMR LAB
Alkaline Phosphatase: 35 units/L — ABNORMAL LOW (ref 39–117)
BUN: 12 mg/dL (ref 6–23)
Eosinophils Absolute: 0.1 10*3/uL (ref 0.0–0.7)
Eosinophils Relative: 3 % (ref 0–5)
Glucose, Bld: 88 mg/dL (ref 70–99)
HCT: 41.5 % (ref 36.0–46.0)
Lymphs Abs: 1.8 10*3/uL (ref 0.7–4.0)
MCV: 92.4 fL (ref 78.0–100.0)
Monocytes Absolute: 0.6 10*3/uL (ref 0.1–1.0)
Monocytes Relative: 12 % (ref 3–12)
RBC: 4.49 M/uL (ref 3.87–5.11)
Total Bilirubin: 0.6 mg/dL (ref 0.3–1.2)
WBC: 4.8 10*3/uL (ref 4.0–10.5)

## 2010-07-12 ENCOUNTER — Encounter (INDEPENDENT_AMBULATORY_CARE_PROVIDER_SITE_OTHER): Payer: Self-pay | Admitting: Internal Medicine

## 2010-07-14 ENCOUNTER — Inpatient Hospital Stay (INDEPENDENT_AMBULATORY_CARE_PROVIDER_SITE_OTHER)
Admission: RE | Admit: 2010-07-14 | Discharge: 2010-07-14 | Disposition: A | Discharge status: Home / Self Care | Payer: Self-pay | Source: Ambulatory Visit | Attending: Emergency Medicine | Admitting: Emergency Medicine

## 2010-07-14 DIAGNOSIS — R300 Dysuria: Secondary | ICD-10-CM

## 2010-07-14 DIAGNOSIS — N760 Acute vaginitis: Secondary | ICD-10-CM

## 2010-07-14 LAB — POCT URINALYSIS DIPSTICK
Nitrite: NEGATIVE
Urine Glucose, Fasting: NEGATIVE mg/dL
Urobilinogen, UA: 0.2 mg/dL (ref 0.0–1.0)

## 2010-07-14 LAB — WET PREP, GENITAL: Trich, Wet Prep: NONE SEEN

## 2010-07-15 ENCOUNTER — Telehealth (INDEPENDENT_AMBULATORY_CARE_PROVIDER_SITE_OTHER): Payer: Self-pay | Admitting: Nurse Practitioner

## 2010-07-15 LAB — GC/CHLAMYDIA PROBE AMP, GENITAL
Chlamydia, DNA Probe: NEGATIVE
GC Probe Amp, Genital: NEGATIVE

## 2010-07-15 LAB — URINE CULTURE: Culture  Setup Time: 201202160129

## 2010-07-15 NOTE — Assessment & Plan Note (Signed)
Summary: CMET&CBC  Nurse Visit   Allergies: No Known Drug Allergies  Orders Added: 1)  Est. Patient Level I [09811] 2)  T-Comprehensive Metabolic Panel [[id number]] 3)  T-CBC w/Diff [[id number]]

## 2010-07-15 NOTE — Progress Notes (Signed)
Summary: Fax lab results to Dr. Ian Malkin office at [facility]  Phone Note Call from Patient   Caller: Patient Summary of Call: Pt. called -- States she called Dr. Henriette Combs office but they are not able to refill her med b/c they have not rec'd any lab reports from [place] -- Pt. states that we usually fax over the labs done here to them -- I will call Dr. Laural Benes office tomorrow to get the fax number (phone:[phone]) since it's past 5:00pm -- Hale Drone CMA  July 01, 2010 5:09 PM   Follow-up for Phone Call        It does not appear she has come in for her routine  labs as requested--should be coming in every 6 weeks.   Pt. is supposed to set up an lab appt. for repeat labs in 6 weeks every time she has these labs drawn--does not appear she did this after last August. Needs to get in for ASAP CMET and CBC. Follow-up by: Julieanne Manson MD,  July 02, 2010 8:54 AM  Additional Follow-up for Phone Call Additional follow up Details #1::        Called pt. -- notified her of the above info. -- Sched. her a lab appt. tomorrow 07/06/10 to come in for a CMET and CBC per Dr. Delrae Alfred. -- We are to fax results to [facility] afterwards -- Hale Drone CMA  July 05, 2010 4:11 PM

## 2010-07-18 ENCOUNTER — Inpatient Hospital Stay (INDEPENDENT_AMBULATORY_CARE_PROVIDER_SITE_OTHER)
Admission: RE | Admit: 2010-07-18 | Discharge: 2010-07-18 | Disposition: A | Discharge status: Home / Self Care | Payer: Self-pay | Source: Ambulatory Visit | Attending: Emergency Medicine | Admitting: Emergency Medicine

## 2010-07-18 DIAGNOSIS — R319 Hematuria, unspecified: Secondary | ICD-10-CM

## 2010-07-18 LAB — POCT URINALYSIS DIPSTICK
Bilirubin Urine: NEGATIVE
Specific Gravity, Urine: 1.02 (ref 1.005–1.030)
Urine Glucose, Fasting: NEGATIVE mg/dL
Urobilinogen, UA: 0.2 mg/dL (ref 0.0–1.0)

## 2010-07-21 ENCOUNTER — Telehealth (INDEPENDENT_AMBULATORY_CARE_PROVIDER_SITE_OTHER): Payer: Self-pay | Admitting: Internal Medicine

## 2010-07-21 NOTE — Letter (Signed)
Summary: *HSN Results Follow up  [facility]  [address] [place], [place] [address]   Phone: [phone]  Fax: [phone]      07/12/2010   Haley Norton [address] [place], [place]  [address]   Dear  [name],                            ____S.Drinkard,FNP   ____D. Gore,FNP       ____B. McPherson,MD   ____V. Rankins,MD    __X__E. Yishai Rehfeld,MD    ____N. Daphine Deutscher, FNP  ____D. Reche Dixon, MD    ____K. Philipp Deputy, MD    ____Other     This letter is to inform you that your recent test(s):  _______Pap Smear    ____X___Lab Test     _______X-ray    __X_____ is within acceptable limits  _______ requires a medication change  _______ requires a follow-up lab visit  _______ requires a follow-up visit with your provider   Comments:  Faxed to Dr. Mechele Dawley       _________________________________________________________ If you have any questions, please contact our office                     Sincerely,  Julieanne Manson MD [facility]

## 2010-07-21 NOTE — Progress Notes (Signed)
Summary: Med additions  Phone Note Outgoing Call   Summary of Call: Received call from the pharmacy - pt went to urgent care on yesterday (apparently under the direction of this office - however no indication that she called and was triaged) She was prescribed macrobid, flagyl, and pyridium. I gave ok for pharmacy to approve the medications as prescribed from the ER Initial call taken by: Lehman Prom FNP,  July 15, 2010 9:12 AM

## 2010-07-27 NOTE — Letter (Signed)
Summary: DEPARTMENT OF RHEUMATOLOGY & IMMUNOLOGY  DEPARTMENT OF RHEUMATOLOGY & IMMUNOLOGY   Imported By: Arta Bruce 07/22/2010 13:45:08  _____________________________________________________________________  External Attachment:    Type:   Image     Comment:   External Document

## 2010-07-29 ENCOUNTER — Encounter: Payer: Self-pay | Admitting: Internal Medicine

## 2010-07-29 ENCOUNTER — Encounter (INDEPENDENT_AMBULATORY_CARE_PROVIDER_SITE_OTHER): Payer: Self-pay | Admitting: Internal Medicine

## 2010-07-29 LAB — CONVERTED CEMR LAB
Bilirubin Urine: NEGATIVE
Ketones, urine, test strip: NEGATIVE
Protein, U semiquant: NEGATIVE
Specific Gravity, Urine: >=1.03
Urobilinogen, UA: 0.2
Whiff Test: NEGATIVE

## 2010-08-05 NOTE — Progress Notes (Signed)
  Phone Note From Other Clinic   Summary of Call: From [facility]:  Dr. Everlene Balls.   Pt. now to have CBC, Creatinine and Hepatic Fucntion Panel every 8 weeks--fax to her at [phone] Please reschedule those labs for pt--we were having her come in every 6 weeks--next visit should be 8 weeks from 07/06/10 Initial call taken by: Julieanne Manson MD,  July 21, 2010 8:22 AM  Follow-up for Phone Call        Called pt. -- Notified her of the above info. -- Sched. her an appt. for 08/31/10 @ 9:15am for a CBC, Creatinine, and Hepatic Function Panel. -- Hale Drone CMA  July 28, 2010 9:26 AM

## 2010-08-05 NOTE — Assessment & Plan Note (Signed)
Summary: UTI   Vital Signs:  Patient profile:   40 year old female Menstrual status:  regular Weight:      169.25 pounds BMI:     27.84 Temp:     99.8 degrees F oral Pulse rate:   76 / minute Pulse rhythm:   regular Resp:     15 per minute BP sitting:   126 / 83  (left arm) Cuff size:   regular  Vitals Entered By: Hale Drone CMA (July 29, 2010 2:43 PM) CC: Dysuria x3 days. Was seen at [facility] urgent for UTI.  Is Patient Diabetic? No Pain Assessment Patient in pain? no       Does patient need assistance? Functional Status Self care Ambulation Normal   Primary Care Provider:  Julieanne Manson MD  CC:  Dysuria x3 days. Was seen at [facility] urgent for UTI. Marland Kitchen  History of Present Illness: 1.  Dysuria:  pt. seen in ED 07/18/10.  Had had dysuria for about 1 week prior.  No vaginal discharge.  No fever.  No back pain.  Was given Macrobid, which she took for 5 days, then was called by ED to stop as her urine culture returned negative.  Wet prep was also negative in ED.  Feeling better, but still with some burning on inside with urination and at night some suprapubic pain.  Has chronic microscopic hematuria--followed by Dr. Hyman Hopes.  Felt to be a benign etiology and not related to any definite kidney disease.  Had normal Urologic workup as well--Ultrasound, cystoscopy Has not been drinking much in way of water--generally does not.  Current Medications (verified): 1)  Ferrous Sulfate 325 (65 Fe) Mg Tabs (Ferrous Sulfate) .... Tomar Una Tableta Diaria (Take One Tablet Once A Day)  Generic For Ferrous Sulf 325mg  2)  Folic Acid 1 Mg Tabs (Folic Acid) .... Tomar 1 Tableta Diaria (Take One Tablet Every Day) 3)  Humira 40 Mg/0.58ml Kit (Adalimumab) .... Inject 40 Mg Subcutaneously Once Every Other Week 4)  Hydroxychloroquine Sulfate 200 Mg Tabs (Hydroxychloroquine Sulfate) .... Tomar 1 Tableta Dos [employer]. (Take One Tablet Twice Daily) 5)  Methotrexate 2.5 Mg Tabs (Methotrexate Sodium) .... Toma  Ocho Tabletas [place]ada Domingo (Take 8 Tablets Every Sunday) 6)  Protonix 40 Mg Tbec (Pantoprazole Sodium) .... Tomar Una Tableta Diaria (Take One Tablet Once A Day) 7)  Xyzal 5 Mg Tabs (Levocetirizine Dihydrochloride) .Marland Kitchen.. 1 Tab By Mouth Daily As Needed Allergies 8)  Nasacort Aq 55 Mcg/act Aers (Triamcinolone Acetonide(Nasal)) .Marland Kitchen.. 1 Spray in Each Nostril Two Times A Day 9)  Optivar 0.05 % Soln (Azelastine Hcl) .Marland Kitchen.. 1 Drop in Both Eyes Two Times A Day 10)  Fluconazole 150 Mg Tabs (Fluconazole) .Marland Kitchen.. 1 Tab By Mouth Daily For 2 Days 11)  Ciprofloxacin Hcl 250 Mg Tabs (Ciprofloxacin Hcl) .Marland Kitchen.. 1 Tab By Mouth Two Times A Day For Three Days.  Allergies (verified): No Known Drug Allergies  Physical Exam  General:  NAD Abdomen:  Bowel sounds positive,abdomen soft and non-tender without masses, organomegaly or hernias noted.  No suprapubic or flank tenderness.   Impression & Recommendations:  Problem # 1:  DYSURIA (ICD-788.1) Yeast vaginitis The following medications were removed from the medication list:    Ciprofloxacin Hcl 250 Mg Tabs (Ciprofloxacin hcl) .Marland Kitchen... 1 tab by mouth two times a day for three days.  Complete Medication List: 1)  Ferrous Sulfate 325 (65 Fe) Mg Tabs (Ferrous sulfate) .... Tomar una tableta diaria (take one tablet once a day)  generic for ferrous sulf 325mg  2)  Folic Acid 1 Mg Tabs (Folic acid) .... Tomar 1 tableta diaria (take one tablet every day) 3)  Humira 40 Mg/0.11ml Kit (Adalimumab) .... Inject 40 mg subcutaneously once every other week 4)  Hydroxychloroquine Sulfate 200 Mg Tabs (Hydroxychloroquine sulfate) .... Tomar 1 tableta dos veces al dia. (take one tablet twice daily) 5)  Methotrexate 2.5 Mg Tabs (Methotrexate sodium) .... Toma ocho tabletas cada domingo (take 8 tablets every sunday) 6)  Protonix 40 Mg Tbec (Pantoprazole sodium) .... Tomar una tableta diaria (take one tablet once a day) 7)  Xyzal 5 Mg Tabs (Levocetirizine dihydrochloride) .... 1 tab by mouth  daily as needed allergies 8)  Nasacort Aq 55 Mcg/act Aers (Triamcinolone acetonide(nasal)) .... 1 spray in each nostril two times a day 9)  Optivar 0.05 % Soln (Azelastine hcl) .... 1 drop in both eyes two times a day 10)  Fluconazole 150 Mg Tabs (Fluconazole) .... 1 tab by mouth daily for 2 days  Patient Instructions: 1)  Call if burning does not improve with treatment of yeast infection Prescriptions: FLUCONAZOLE 150 MG TABS (FLUCONAZOLE) 1 tab by mouth daily for 2 days  #2 x 0   Entered and Authorized by:   Giannamarie Paulus MD   Signed by:   Shaletta Hinostroza MD on 07/29/2010   Method used:   Faxed to ...       [facility])       [address] [place], [place]  [address]       Ph: [phone]       Fax: [phone]   RxID:   [id number]    Orders Added: 1)  Est. Patient Level III [99213]    Laboratory Results   Urine Tests  Date/Time Received: July 29, 2010 2:52 PM   Routine Urinalysis   Color: yellow Glucose: negative   (Normal Range: Negative) Bilirubin: negative   (Normal Range: Negative) Ketone: negative   (Normal Range: Negative) Spec. Gravity: >=1.030   (Normal Range: 1.003-1.035) Blood: large   (Normal Range: Negative) pH: 5.0   (Normal Range: 5.0-8.0) Protein: negative   (Normal Range: Negative) Urobilinogen: 0.2   (Normal Range: 0-1) Nitrite: negative   (Normal Range: Negative) Leukocyte Esterace: negative   (Normal Range: Negative)      Wet Mount Source: self swa b WBC/hpf: 1-5 Bacteria/hpf: 1+ Clue cells/hpf: none  Negative whiff Yeast/hpf: many Wet Mount KOH: 2+ Trichomonas/hpf: none   Laboratory Results   Urine Tests    Routine Urinalysis   Color: yellow Glucose: negative   (Normal Range: Negative) Bilirubin: negative   (Normal Range: Negative) Ketone: negative   (Normal Range: Negative) Spec. Gravity: >=1.030   (Normal Range: 1.003-1.035) Blood: large   (Normal  Range: Negative) pH: 5.0   (Normal Range: 5.0-8.0) Protein: negative   (Normal Range: Negative) Urobilinogen: 0.2   (Normal Range: 0-1) Nitrite: negative   (Normal Range: Negative) Leukocyte Esterace: negative   (Normal Range: Negative)      Wet Mount/KOH  Negative whiff

## 2010-08-17 ENCOUNTER — Encounter (INDEPENDENT_AMBULATORY_CARE_PROVIDER_SITE_OTHER): Payer: Self-pay | Admitting: Internal Medicine

## 2010-10-15 NOTE — Group Therapy Note (Signed)
NAME:  Haley Norton, Haley Norton NO.:  [id number]   MEDICAL RECORD NO.:  [id number]          PATIENT TYPE:  WOC   LOCATION:  [facility]                   FACILITY:  [facility]   PHYSICIAN:  Argentina Donovan, MD        DATE OF BIRTH:  1971/05/08   DATE OF SERVICE:  10/13/2005                                    CLINIC NOTE   DATE OF VISIT:  Oct 13, 2005   REASON FOR VISIT:  The patient is a 40 year old, G2, P2-0-0-2 with a history  of two cesarean sections, one in [place] and one in [place] with a  vertical incision on the abdomen.  She comes in requesting a laparoscopic  sterilization.  We have talked to her with translator about her options,  especially those related to IUDs.  She has sever rheumatoid arthritis and is  on multiple medications.  Among those are sulfasalazine, folic acid,  prednisone, methotrexate, Protonix, ferrous sulfate and Humira.  After  discussion where we have talked about possible complications, compared them  with cesarean section and told her she could have injury to bowel, urinary  tract, hemorrhage or infection, anesthetic complications and death were  possible complications of the procedure, she understands and still wants to  go through with the procedure.   PHYSICAL EXAMINATION:  VITAL SIGNS:  Blood pressure 136/75, pulse 69,  temperature 98 degrees.  Height 5 feet 2 inches, weight 168.  GENERAL:  Well-developed, well-nourished, Hispanic female in no acute  distress.  HEENT:  Within normal limits.  LUNGS:  Clear to auscultation and percussion.  HEART:  No murmur in normal sinus rhythm.  BACK:  No CVA tenderness.  ABDOMEN:  Soft, flat, nontender.  No masses or organomegaly.  Vertical  surgical abdominal scar.  PELVIC:  External genitalia normal.  BUS within normal limits.  Vagina is  clear and well-rugated.  Cervix is clean and parous.  Adnexa could not be  palpated because of habitus of patient.  EXTREMITIES:  Normal with the exception of some  swelling of the knee  secondary to rheumatoid arthritis.   FAMILY HISTORY:  Noncontributory.  The patient has been on treatment for  arthritis for a long time.   IMPRESSION:  1.  Desires voluntary sterilization.  2.  Severe rheumatoid arthritis.           ______________________________  Argentina Donovan, MD     PR/MEDQ  D:  10/13/2005  T:  10/14/2005  Job:  [id number]

## 2010-10-15 NOTE — Group Therapy Note (Signed)
NAMEJANEQUA, KIPNIS NO.:  [id number]   MEDICAL RECORD NO.:  [id number]          PATIENT TYPE:  WOC   LOCATION[facility]  [facility]                   FACILITY:  [facility]   PHYSICIAN:  Dorthula Perfect, MD     DATE OF BIRTH:  [birth year]   DATE OF SERVICE:                                    CLINIC NOTE   HISTORY OF PRESENT ILLNESS:  This 40 year old Hispanic female returned for  postoperative check.  She underwent laparoscopic tubal sterilization June  11.  She has no complaints at this time.  She has not started her menstrual  period yet.   PHYSICAL EXAMINATION:  VITAL SIGNS:  Blood pressure normal.  ABDOMEN:  Soft, nontender.  No masses are felt.  PELVIC:  External genitalia, BUN, Skene's normal.  Vaginal wall  epithelialized as was the cervix.  Uterus was slightly retroflexed and it is  of normal size and shape.  Adnexa structures were normal.   IMPRESSION:  Status post laparoscopic tubal sterilization.   DISPOSITION:  The patient should have her next annual Pap smear next  February.           ______________________________  Dorthula Perfect, MD     ER/MEDQ  D:  12/01/2005  T:  12/01/2005  Job:  [id number]

## 2010-10-15 NOTE — Op Note (Signed)
NAME:  Haley Norton, Haley Norton NO.:  [id number]   MEDICAL RECORD NO.:  [id number]          PATIENT TYPE:  AMB   LOCATION:  SDC                           FACILITY:  [facility]   PHYSICIAN:  Phil D. Okey Dupre, M.D.     DATE OF BIRTH:  03-24-1971   DATE OF PROCEDURE:  11/07/2005  DATE OF DISCHARGE:                                 OPERATIVE REPORT   PROCEDURE:  Laparoscopic sterilization.   PREOPERATIVE DIAGNOSIS:  Voluntary sterilization.   POSTOPERATIVE DIAGNOSIS:  Voluntary sterilization.   SURGEON:  Javier Glazier. Okey Dupre, M.D.   ANESTHESIA:  General.   ESTIMATED BLOOD LOSS:  Minimal.   SPECIMENS TO PATHOLOGY:  None.   PROCEDURE:  Under satisfactory anesthesia with the patient in the dorsal  lithotomy position, perineum, vagina, and abdomen were prepped and draped in  the usual sterile manner.  Bimanual pelvic examination under anesthesia  revealed the uterus in second degree retroversion, freely movable with  normal free adnexa.  A Graves open speculum was placed into the vagina to  visualize the cervix.  The anterior lip of the cervix was grasped with a  single-tooth tenaculum, and an acorn cannula placed the cervix and attached  to vacuum for mobilization of the uterus. Veress needle inserted in the  peritoneal cavity at the lower pole of the umbilicus. Approximately 3 liters  of carbon dioxide was slowly insufflated into the peritoneal cavity.  Equal  tympany occurred over the entire abdominal wall.  The Veress needle was  removed.  A 1 cm transverse incision was made just below the umbilicus, and  a laparoscopic trocar inserted into the peritoneal cavity.  Trocar removed  with sleeve.  Laparoscope inserted and the pelvic organs easily visualized.  There was marked scarring between the bladder and the anterior lower segment  of the uterus from a previous cesarean section, and multiple filmy adhesions  in that area. Other than that, there were no abnormalities noted.  Each tube  was  grasped in the midpoint and coagulated until blanching occurred with a  bipolar coagulator.  As much CO2 as possible was expressed through the  sleeve after the scope was removed.  The sleeve was removed and the incision  closed with 3-0 Vicryl suture and the patient transferred to the recovery  room in satisfactory condition.           ______________________________  Javier Glazier Okey Dupre, M.D.     PDR/MEDQ  D:  11/07/2005  T:  11/07/2005  Job:  [id number]

## 2010-11-04 ENCOUNTER — Other Ambulatory Visit: Payer: Self-pay | Admitting: Family Medicine

## 2010-12-04 ENCOUNTER — Inpatient Hospital Stay (INDEPENDENT_AMBULATORY_CARE_PROVIDER_SITE_OTHER)
Admission: RE | Admit: 2010-12-04 | Discharge: 2010-12-04 | Disposition: A | Discharge status: Home / Self Care | Payer: Self-pay | Source: Ambulatory Visit | Attending: Family Medicine | Admitting: Family Medicine

## 2010-12-04 DIAGNOSIS — N390 Urinary tract infection, site not specified: Secondary | ICD-10-CM

## 2010-12-04 DIAGNOSIS — R300 Dysuria: Secondary | ICD-10-CM

## 2010-12-04 LAB — POCT URINALYSIS DIP (DEVICE)
Bilirubin Urine: NEGATIVE
Ketones, ur: NEGATIVE mg/dL
Leukocytes, UA: NEGATIVE
Nitrite: NEGATIVE
Protein, ur: NEGATIVE mg/dL

## 2010-12-04 LAB — POCT PREGNANCY, URINE: Preg Test, Ur: NEGATIVE

## 2010-12-06 LAB — URINE CULTURE: Colony Count: NO GROWTH

## 2011-04-06 ENCOUNTER — Emergency Department (INDEPENDENT_AMBULATORY_CARE_PROVIDER_SITE_OTHER)
Admission: EM | Admit: 2011-04-06 | Discharge: 2011-04-06 | Disposition: A | Discharge status: Home / Self Care | Payer: Self-pay | Source: Home / Self Care | Attending: Emergency Medicine | Admitting: Emergency Medicine

## 2011-04-06 DIAGNOSIS — M1289 Other specific arthropathies, not elsewhere classified, multiple sites: Secondary | ICD-10-CM | POA: Insufficient documentation

## 2011-04-06 DIAGNOSIS — N6019 Diffuse cystic mastopathy of unspecified breast: Secondary | ICD-10-CM

## 2011-04-06 HISTORY — DX: Other specific arthropathies, not elsewhere classified, multiple sites: M12.89

## 2011-04-06 HISTORY — DX: Gastro-esophageal reflux disease without esophagitis: K21.9

## 2011-04-06 NOTE — ED Provider Notes (Signed)
History     CSN: [id number] Arrival date & time: 04/06/2011  8:17 PM   First MD Initiated Contact with Patient 04/06/11 2026      Chief Complaint  Patient presents with  . Breast Pain    (Consider location/radiation/quality/duration/timing/severity/associated sxs/prior treatment) HPI Comments: Haley Norton is a 40 year old female who has had a three-day history of soreness of both breasts, particularly around the nipple area. She denies any discharge from the nipples, swelling of the breasts, redness, or masses. He's not had a fever chills. Her last menstrual period was in October and she had a tubal ligation. He's had no other pregnancy symptoms. He's not taking any hormonal medication. She is taking Humira and methotrexate for rheumatoid arthritis. She denies any other gynecologic complaints. No pelvic pain, menses have been regular, no discharge.   Past Medical History  Diagnosis Date  . Rheumatic joint disease   . GERD (gastroesophageal reflux disease)     History reviewed. No pertinent past surgical history.  History reviewed. No pertinent family history.  History  Substance Use Topics  . Smoking status: Never Smoker   . Smokeless tobacco: Not on file  . Alcohol Use: No    OB History    Grav Para Term Preterm Abortions TAB SAB Ect Mult Living                  Review of Systems  Constitutional: Negative for fever and chills.  Genitourinary: Negative for vaginal bleeding, vaginal discharge, vaginal pain, menstrual problem and pelvic pain.  Skin: Negative for color change, pallor, rash and wound.    Allergies  Review of patient's allergies indicates no known allergies.  Home Medications   Current Outpatient Rx  Name Route Sig Dispense Refill  . FOLIC ACID 1 MG PO TABS Oral Take 1 mg by mouth daily.      Marland Kitchen METHOTREXATE 2.5 MG PO TABS Oral Take 2.5 mg by mouth once a week. Caution:Chemotherapy. Protect from light.     Marland Kitchen PANTOPRAZOLE SODIUM 20 MG PO TBEC Oral Take 20 mg  by mouth daily.        BP 123/51  Pulse 63  Temp(Src) 98.6 F (37 C) (Oral)  Resp 16  SpO2 100%  Physical Exam  Constitutional: She appears well-developed and well-nourished. No distress.  Cardiovascular: Normal rate, regular rhythm, normal heart sounds and intact distal pulses.  Exam reveals no gallop and no friction rub.   No murmur heard. Pulmonary/Chest: Effort normal and breath sounds normal. No respiratory distress. She has no wheezes. She has no rales.  Abdominal: Soft. Bowel sounds are normal. She exhibits no distension and no mass. There is no tenderness. There is no rebound and no guarding.  Skin: Skin is warm and dry. No rash noted. She is not diaphoretic. No erythema. No pallor.       Breast exam reveals no masses. She had mild tenderness to palpation over both nipples. There is no nipple discharge. No skin changes. No axillary adenopathy.    ED Course  Procedures (including critical care time)  Labs Reviewed - No data to display No results found.   1. Fibrocystic breast disease       MDM  I think she has fibrocystic breast disease which may be due to hormonal changes or possibly some other medication that she is on. I think she does need to followup with a gynecologist and she will need a mammogram when she turns 41.        Onalee Hua  Matthew Folks, MD 04/06/11 2115

## 2011-04-06 NOTE — ED Notes (Signed)
C/o bilateral breast pain, stabbing, for past 3-4 days; denies trauma

## 2011-06-29 ENCOUNTER — Emergency Department (HOSPITAL_COMMUNITY)
Admission: EM | Admit: 2011-06-29 | Discharge: 2011-06-29 | Disposition: A | Discharge status: Home / Self Care | Payer: Self-pay | Attending: Emergency Medicine | Admitting: Emergency Medicine

## 2011-06-29 ENCOUNTER — Encounter (HOSPITAL_COMMUNITY): Payer: Self-pay | Admitting: Emergency Medicine

## 2011-06-29 DIAGNOSIS — M069 Rheumatoid arthritis, unspecified: Secondary | ICD-10-CM | POA: Insufficient documentation

## 2011-06-29 DIAGNOSIS — K219 Gastro-esophageal reflux disease without esophagitis: Secondary | ICD-10-CM | POA: Insufficient documentation

## 2011-06-29 DIAGNOSIS — Z79899 Other long term (current) drug therapy: Secondary | ICD-10-CM | POA: Insufficient documentation

## 2011-06-29 DIAGNOSIS — R110 Nausea: Secondary | ICD-10-CM | POA: Insufficient documentation

## 2011-06-29 DIAGNOSIS — R51 Headache: Secondary | ICD-10-CM | POA: Insufficient documentation

## 2011-06-29 MED ORDER — KETOROLAC TROMETHAMINE 30 MG/ML IJ SOLN
30.0000 mg | Freq: Once | INTRAMUSCULAR | Status: AC
  Administered 2011-06-29: 30 mg via INTRAMUSCULAR
  Filled 2011-06-29: qty 1

## 2011-06-29 MED ORDER — METOCLOPRAMIDE HCL 5 MG/ML IJ SOLN
10.0000 mg | Freq: Once | INTRAMUSCULAR | Status: AC
  Administered 2011-06-29: 10 mg via INTRAMUSCULAR
  Filled 2011-06-29: qty 2

## 2011-06-29 MED ORDER — DIPHENHYDRAMINE HCL 25 MG PO CAPS
25.0000 mg | ORAL_CAPSULE | Freq: Once | ORAL | Status: AC
  Administered 2011-06-29: 25 mg via ORAL
  Filled 2011-06-29: qty 1

## 2011-06-29 NOTE — ED Notes (Signed)
Family at bedside. 

## 2011-06-29 NOTE — ED Notes (Signed)
PT. REPORTS LEFT SIDE HEADACHE WITH NAUSEA ONSET TODAY , DENIES INJURY , SLIGHT NAUSEA , NO VOMITTING OR FEVER .

## 2011-06-29 NOTE — ED Notes (Signed)
Pt denies taking any pain medication at home

## 2011-06-29 NOTE — ED Notes (Signed)
Pt c/o L side headache, nausea, photophobia, and L side ear pain starting this am. No neuro deficits noted

## 2011-06-29 NOTE — ED Provider Notes (Signed)
History     CSN: [id number]  Arrival date & time 06/29/11  2052   First MD Initiated Contact with Patient 06/29/11 2218      Chief Complaint  Patient presents with  . Headache    (Consider location/radiation/quality/duration/timing/severity/associated sxs/prior treatment) HPI Comments: Spanish interpretor was used to interview patient.  Spanish interpretor Vernona Rieger code [id number] was used.  Patient reports gradual onset of a headache this morning.  She reports that the pain has improved somewhat since onset.  She describes the pain as mild.  She denies any recent head injury.  She has not taken anything for the pain.  Patient's primary care physician is Julieanne Manson with [facility].  Patient is a 41 y.o. female presenting with headaches. The history is provided by the patient.  Headache  This is a new problem. The problem occurs constantly. The problem has been gradually improving. The pain is located in the left unilateral region. The pain does not radiate. Associated symptoms include nausea. Pertinent negatives include no fever, no malaise/fatigue, no chest pressure, no near-syncope, no syncope, no shortness of breath and no vomiting. She has tried nothing for the symptoms.    Past Medical History  Diagnosis Date  . Rheumatic joint disease   . GERD (gastroesophageal reflux disease)     History reviewed. No pertinent past surgical history.  No family history on file.  History  Substance Use Topics  . Smoking status: Never Smoker   . Smokeless tobacco: Not on file  . Alcohol Use: No    OB History    Grav Para Term Preterm Abortions TAB SAB Ect Mult Living                  Review of Systems  Constitutional: Negative for fever, chills and malaise/fatigue.  HENT: Negative for ear pain, congestion, neck pain, neck stiffness and sinus pressure.   Eyes: Negative for photophobia, pain and visual disturbance.  Respiratory: Negative for shortness of breath.   Cardiovascular:  Negative for syncope and near-syncope.  Gastrointestinal: Positive for nausea. Negative for vomiting.  Skin: Negative for rash.  Neurological: Positive for headaches. Negative for dizziness, syncope, speech difficulty, weakness, light-headedness and numbness.  Psychiatric/Behavioral: Negative for confusion.    Allergies  Review of patient's allergies indicates no known allergies.  Home Medications   Current Outpatient Rx  Name Route Sig Dispense Refill  . ADALIMUMAB 40 MG/0.8ML [place] KIT Subcutaneous Inject 40 mg into the skin every 7 (seven) days. On Tuesdays    . FERROUS SULFATE 325 (65 FE) MG PO TABS Oral Take 325 mg by mouth daily with breakfast.    . FOLIC ACID 1 MG PO TABS Oral Take 1 mg by mouth daily.      Marland Kitchen HYDROXYCHLOROQUINE SULFATE 200 MG PO TABS Oral Take 200 mg by mouth 2 (two) times daily.    Marland Kitchen METHOTREXATE 2.5 MG PO TABS Oral Take 20 mg by mouth once a week. On Sundays; Caution:Chemotherapy. Protect from light.    Marland Kitchen PANTOPRAZOLE SODIUM 40 MG PO TBEC Oral Take 40 mg by mouth daily.      BP 117/69  Pulse 77  Temp(Src) 98.4 F (36.9 C) (Oral)  Resp 18  SpO2 91%  LMP 06/04/2011  Physical Exam  Nursing note and vitals reviewed. Constitutional: She is oriented to person, place, and time. She appears well-developed and well-nourished. No distress.  HENT:  Head: Normocephalic and atraumatic.  Mouth/Throat: Oropharynx is clear and moist.  Eyes: EOM are normal. Pupils  are equal, round, and reactive to light.  Neck: Normal range of motion. Neck supple.  Cardiovascular: Normal rate, regular rhythm and normal heart sounds.   Pulmonary/Chest: Effort normal and breath sounds normal.  Musculoskeletal: Normal range of motion.  Neurological: She is alert and oriented to person, place, and time. She has normal strength and normal reflexes. No cranial nerve deficit or sensory deficit. Coordination and gait normal.       Normal finger to nose testing. Normal Rapid Alternating  movements. No ataxia.   Skin: Skin is warm and dry. No rash noted. She is not diaphoretic.  Psychiatric: She has a normal mood and affect.    ED Course  Procedures (including critical care time)  Labs Reviewed - No data to display No results found.   1. Headache     11:26 PM Reassessed patient.  Patient reports that her headache has completely resolved.  Will discharge patient home.  MDM  Patient comes in today for headache.  Headache gradual onset.  No head injury.  No fever, visual changes, numbness, weakness, rash, ataxia, or other concerning signs.  Normal neurological exam.  Headache completely resolved with Benadryl, Toradol, and Reglan.  Therefore, feel that  imaging is not indicated at this time and patient can be discharged home.        Pascal Lux [place], PA-C 06/29/11 2334

## 2011-06-29 NOTE — ED Provider Notes (Signed)
Medical screening examination/treatment/procedure(s) were performed by non-physician practitioner and as supervising physician I was immediately available for consultation/collaboration.  Juliet Rude. Rubin Payor, MD 06/29/11 [phone]

## 2011-08-30 ENCOUNTER — Encounter (HOSPITAL_COMMUNITY): Payer: Self-pay | Admitting: Emergency Medicine

## 2011-08-30 ENCOUNTER — Emergency Department (HOSPITAL_COMMUNITY)
Admission: EM | Admit: 2011-08-30 | Discharge: 2011-08-30 | Disposition: A | Discharge status: Home / Self Care | Payer: Self-pay | Source: Home / Self Care | Attending: Family Medicine | Admitting: Family Medicine

## 2011-08-30 DIAGNOSIS — N341 Nonspecific urethritis: Secondary | ICD-10-CM

## 2011-08-30 LAB — GLUCOSE, CAPILLARY: Glucose-Capillary: 99 mg/dL (ref 70–99)

## 2011-08-30 LAB — POCT URINALYSIS DIP (DEVICE)
Bilirubin Urine: NEGATIVE
Ketones, ur: NEGATIVE mg/dL
Nitrite: NEGATIVE
Specific Gravity, Urine: 1.01 (ref 1.005–1.030)
pH: 6 (ref 5.0–8.0)

## 2011-08-30 MED ORDER — PHENAZOPYRIDINE HCL 200 MG PO TABS: 200.0000 mg | ORAL_TABLET | Freq: Three times a day (TID) | ORAL | Status: AC

## 2011-08-30 MED ORDER — INDOMETHACIN 50 MG PO CAPS: 50.0000 mg | ORAL_CAPSULE | Freq: Two times a day (BID) | ORAL | Status: AC

## 2011-08-30 MED ORDER — CEPHALEXIN 500 MG PO CAPS: 500.0000 mg | ORAL_CAPSULE | Freq: Two times a day (BID) | ORAL | Status: AC

## 2011-08-30 NOTE — Discharge Instructions (Signed)
Tome los medicamentos como se le ha indicado. Considere el uso de lubricantes vaginales durante el acto sexual. Beatrix Shipper despues de tener relaiones sexuales le puede ayudar a prevenir infeccion de [place]. Debe regresar si comienza con fiebre o empeoran sus sintomas a pesar de Technical brewer. Debe ver a su medico primario si sus sintomas persistent o si se acompaan de exacerbacion de su arthritis ya que algunas veces urethritis puede ser una manifestacion del sindrome de [place] que puede verse con arthritis reumatoidea.     Uretritis, Adulto (Urethritis, Adult) La uretritis es una inflamacin (irritacin) de la uretra (el tubo que sale de la vejiga). Es causada a menudo por grmenes que se transmiten durante el contacto sexual. TRATAMIENTO La uretritis generalmente responde bien a los antibiticos. Se trata de medicamentos que destruyen los grmenes. Tome todos los [employer] han indicado. Podr sentirse bien dentro de [address], pero DEBE TOMAR TODOS LOS MEDICAMENTOS o la infeccin no responder y luego ser ms difcil de tratar. Generalmente se espera una [employer] y los [address]. Podr requerir un tratamiento adicional luego de someterse a Press photographer. INSTRUCCIONES PARA EL CUIDADO DOMICILIARIO  No mantenga relaciones sexuales hasta que se conozcan los resultados de las pruebas y complete [place].   Le solicitarn que informe a su pareja sexual cuando tenga los [employer].   Tome todos los medicamentos tal como se le indic.   Evite las infecciones transmitidas sexualmente, incluido el SIDA. Practique el sexo seguro. Use preservativos.  SOLICITE ATENCIN MDICA SI:  Sus sntomas no mejoran en dos o [place].   Los sntomas empeoran.   Presenta dolor en el vientre (abdominal).   Siente dolor en el pecho.  SOLICITE ATENCIN MDICA DE INMEDIATO SI:  Tiene fiebre.   Comienza a sentir Herbalist intenso en el abdomen, la  espalda o un lado del cuerpo.   Tiene vmitos que se repiten.  RESULTADOS DE LAS PRUEBAS: Durante su visita no contar con todos los [employer]. En este caso, tenga otra entrevista con su mdico para conocerlos. No piense que el resultado es normal si no tiene noticias de su mdico o de la institucin mdica. Es Copy seguimiento de todos los [place] de [place]. Document Released: 02/23/2005 Document Revised: 05/05/2011 [facility] Patient Information 2012 [place], [place].Uretritis, Adulto (Urethritis, Adult) La uretritis es una inflamacin (irritacin) de la uretra (el tubo que sale de la vejiga). Es causada a menudo por grmenes que se transmiten durante el contacto sexual. TRATAMIENTO La uretritis generalmente responde bien a los antibiticos. Se trata de medicamentos que destruyen los grmenes. Tome todos los [employer] han indicado. Podr sentirse bien dentro de [address], pero DEBE TOMAR TODOS LOS MEDICAMENTOS o la infeccin no responder y luego ser ms difcil de tratar. Generalmente se espera una [employer] y los [address]. Podr requerir un tratamiento adicional luego de someterse a Press photographer. INSTRUCCIONES PARA EL CUIDADO DOMICILIARIO  No mantenga relaciones sexuales hasta que se conozcan los resultados de las pruebas y complete [place].   Le solicitarn que informe a su pareja sexual cuando tenga los [employer].   Tome todos los medicamentos tal como se le indic.   Evite las infecciones transmitidas sexualmente, incluido el SIDA. Practique el sexo seguro. Use preservativos.  SOLICITE ATENCIN MDICA SI:  Sus sntomas no mejoran en dos o [place].   Los sntomas empeoran.  Presenta dolor en el vientre (abdominal).   Siente dolor en el pecho.  SOLICITE ATENCIN MDICA DE INMEDIATO SI:  Tiene fiebre.   Comienza a sentir Herbalist intenso en el abdomen, la espalda o un lado del cuerpo.    Tiene vmitos que se repiten.  RESULTADOS DE LAS PRUEBAS: Durante su visita no contar con todos los [employer]. En este caso, tenga otra entrevista con su mdico para conocerlos. No piense que el resultado es normal si no tiene noticias de su mdico o de la institucin mdica. Es Copy seguimiento de todos los [place]. Document Released: 02/23/2005 Document Revised: 05/05/2011 [facility] Patient Information 2012 [place], [place].

## 2011-08-30 NOTE — ED Notes (Signed)
Translator stated, shes been having a lot of urination, she has a uti

## 2011-08-31 NOTE — ED Provider Notes (Signed)
History     CSN: [id number]  Arrival date & time 08/30/11  1845   First MD Initiated Contact with Patient 08/30/11 1910      Chief Complaint  Patient presents with  . Urinary Frequency    (Consider location/radiation/quality/duration/timing/severity/associated sxs/prior treatment) HPI Comments: 41 y/o female with h/o rheumatoid arthrtis and GERD here c/o 3 days with urinary frequency and burning on urination. Denies headache, fever, chills or malaise. No conjunctivitis or joint pain or swelling flare up. States her symptoms started day after having intercourse with her husband. (as per patient report the couple had been abstinent for a short period). No nausea or vomiting. No pelvic pain or vaginal discharge.    Past Medical History  Diagnosis Date  . Rheumatic joint disease   . GERD (gastroesophageal reflux disease)     History reviewed. No pertinent past surgical history.  No family history on file.  History  Substance Use Topics  . Smoking status: Never Smoker   . Smokeless tobacco: Not on file  . Alcohol Use: No    OB History    Grav Para Term Preterm Abortions TAB SAB Ect Mult Living                  Review of Systems  Constitutional: Negative for fever, chills, appetite change and fatigue.  Gastrointestinal: Negative for nausea, vomiting and abdominal pain.  Genitourinary: Positive for dysuria and frequency. Negative for hematuria, flank pain, vaginal bleeding, vaginal discharge, pelvic pain and dyspareunia.  Musculoskeletal: Negative for joint swelling and arthralgias.  Neurological: Negative for dizziness and headaches.    Allergies  Review of patient's allergies indicates no known allergies.  Home Medications   Current Outpatient Rx  Name Route Sig Dispense Refill  . ADALIMUMAB 40 MG/0.8ML [place] KIT Subcutaneous Inject 40 mg into the skin every 7 (seven) days. On Tuesdays    . CEPHALEXIN 500 MG PO CAPS Oral Take 1 capsule (500 mg total) by mouth 2 (two)  times daily. 20 capsule 0  . FERROUS SULFATE 325 (65 FE) MG PO TABS Oral Take 325 mg by mouth daily with breakfast.    . FOLIC ACID 1 MG PO TABS Oral Take 1 mg by mouth daily.      Marland Kitchen HYDROXYCHLOROQUINE SULFATE 200 MG PO TABS Oral Take 200 mg by mouth 2 (two) times daily.    . INDOMETHACIN 50 MG PO CAPS Oral Take 1 capsule (50 mg total) by mouth 2 (two) times daily with a meal. 20 capsule 0  . METHOTREXATE 2.5 MG PO TABS Oral Take 20 mg by mouth once a week. On Sundays; Caution:Chemotherapy. Protect from light.    Marland Kitchen PANTOPRAZOLE SODIUM 40 MG PO TBEC Oral Take 40 mg by mouth daily.    Marland Kitchen PHENAZOPYRIDINE HCL 200 MG PO TABS Oral Take 1 tablet (200 mg total) by mouth 3 (three) times daily. 6 tablet 0    BP 150/80  Pulse 80  Temp(Src) 98.3 F (36.8 C) (Oral)  Resp 18  SpO2 97%  LMP 08/07/2011  Physical Exam  Nursing note and vitals reviewed. Constitutional: She is oriented to person, place, and time. She appears well-developed and well-nourished. No distress.  HENT:  Head: Normocephalic and atraumatic.  Mouth/Throat: No oropharyngeal exudate.  Eyes: Conjunctivae are normal. Pupils are equal, round, and reactive to light. No scleral icterus.  Neck: Neck supple. No thyromegaly present.  Cardiovascular: Normal heart sounds.   Pulmonary/Chest: Breath sounds normal.  Abdominal: Soft. She exhibits no distension. There  is no tenderness.       No CVT  Lymphadenopathy:    She has no cervical adenopathy.  Neurological: She is alert and oriented to person, place, and time.  Skin: No rash noted.    ED Course  Procedures (including critical care time)  Labs Reviewed  POCT URINALYSIS DIP (DEVICE) - Abnormal; Notable for the following:    Hgb urine dipstick MODERATE (*)    All other components within normal limits  POCT PREGNANCY, URINE  GLUCOSE, CAPILLARY  LAB REPORT - SCANNED   No results found.   1. Urethritis, nonspecific       MDM  41 y/o female with h/o RA here with symptoms  of urethritis no other symptoms or findings suggestive of rheumatoid disease flare up. Normal CBG. Treated with keflex, pyridium and Indocin. Asked to follow up with her PCP if persistent or new symptoms.         Sharin Grave, MD 08/31/11 1104

## 2011-12-05 ENCOUNTER — Emergency Department (INDEPENDENT_AMBULATORY_CARE_PROVIDER_SITE_OTHER)
Admission: EM | Admit: 2011-12-05 | Discharge: 2011-12-05 | Disposition: A | Discharge status: Home / Self Care | Payer: Self-pay | Source: Home / Self Care | Attending: Family Medicine | Admitting: Family Medicine

## 2011-12-05 ENCOUNTER — Encounter (HOSPITAL_COMMUNITY): Payer: Self-pay | Admitting: Emergency Medicine

## 2011-12-05 DIAGNOSIS — N390 Urinary tract infection, site not specified: Secondary | ICD-10-CM

## 2011-12-05 LAB — POCT PREGNANCY, URINE: Preg Test, Ur: NEGATIVE

## 2011-12-05 LAB — URINE MICROSCOPIC-ADD ON

## 2011-12-05 LAB — URINALYSIS, ROUTINE W REFLEX MICROSCOPIC
Leukocytes, UA: NEGATIVE
Nitrite: NEGATIVE
Specific Gravity, Urine: 1.025 (ref 1.005–1.030)
Urobilinogen, UA: 0.2 mg/dL (ref 0.0–1.0)

## 2011-12-05 LAB — POCT URINALYSIS DIP (DEVICE)
Bilirubin Urine: NEGATIVE
Ketones, ur: NEGATIVE mg/dL
Leukocytes, UA: NEGATIVE
pH: 5.5 (ref 5.0–8.0)

## 2011-12-05 MED ORDER — NITROFURANTOIN MONOHYD MACRO 100 MG PO CAPS: 100.0000 mg | ORAL_CAPSULE | Freq: Two times a day (BID) | ORAL | Status: AC

## 2011-12-05 NOTE — ED Provider Notes (Signed)
History     CSN: [id number]  Arrival date & time 12/05/11  1443   First MD Initiated Contact with Patient 12/05/11 1604      Chief Complaint  Patient presents with  . Urinary Tract Infection    (Consider location/radiation/quality/duration/timing/severity/associated sxs/prior treatment) Patient is a 41 y.o. female presenting with urinary tract infection. The history is provided by the patient and a relative. The history is limited by a language barrier. A language interpreter was used.  Urinary Tract Infection This is a new problem. The current episode started more than 1 week ago. The problem occurs constantly. Nothing relieves the symptoms.   Pt complains of burning with urination and low back pain.  Pt reports she feels like she has a urinary tract infection Past Medical History  Diagnosis Date  . Rheumatic joint disease   . GERD (gastroesophageal reflux disease)     History reviewed. No pertinent past surgical history.  No family history on file.  History  Substance Use Topics  . Smoking status: Never Smoker   . Smokeless tobacco: Not on file  . Alcohol Use: No    OB History    Grav Para Term Preterm Abortions TAB SAB Ect Mult Living                  Review of Systems  All other systems reviewed and are negative.    Allergies  Review of patient's allergies indicates no known allergies.  Home Medications   Current Outpatient Rx  Name Route Sig Dispense Refill  . ADALIMUMAB 40 MG/0.8ML [place] KIT Subcutaneous Inject 40 mg into the skin every 7 (seven) days. On Tuesdays    . FERROUS SULFATE 325 (65 FE) MG PO TABS Oral Take 325 mg by mouth daily with breakfast.    . FOLIC ACID 1 MG PO TABS Oral Take 1 mg by mouth daily.      Marland Kitchen HYDROXYCHLOROQUINE SULFATE 200 MG PO TABS Oral Take 200 mg by mouth 2 (two) times daily.    Marland Kitchen METHOTREXATE 2.5 MG PO TABS Oral Take 20 mg by mouth once a week. On Sundays; Caution:Chemotherapy. Protect from light.    Marland Kitchen PANTOPRAZOLE SODIUM  40 MG PO TBEC Oral Take 40 mg by mouth daily.      BP 119/74  Pulse 68  Temp 98.5 F (36.9 C) (Oral)  Resp 16  SpO2 97%  LMP 11/27/2011  Physical Exam  Vitals reviewed. Constitutional: She appears well-developed and well-nourished.  HENT:  Head: Normocephalic and atraumatic.  Right Ear: External ear normal.  Left Ear: External ear normal.  Eyes: Conjunctivae and EOM are normal. Pupils are equal, round, and reactive to light.  Neck: Normal range of motion. Neck supple.  Cardiovascular: Normal rate, regular rhythm and normal heart sounds.   Pulmonary/Chest: Effort normal and breath sounds normal.  Abdominal: Soft. Bowel sounds are normal.  Musculoskeletal: Normal range of motion.  Neurological: She is alert.  Skin: Skin is warm.    ED Course  Procedures (including critical care time)  Labs Reviewed  POCT URINALYSIS DIP (DEVICE) - Abnormal; Notable for the following:    Hgb urine dipstick LARGE (*)     All other components within normal limits  POCT PREGNANCY, URINE  URINALYSIS, ROUTINE W REFLEX MICROSCOPIC  URINE CULTURE   No results found.   No diagnosis found.    MDM  Pt given rx for macrobid,  Urine and culture ordered.   (Pt not on menses.)  I advised recheck  at [facility] in 1 week, culture pending        Lonia Skinner [place], [place] 12/05/11 1607

## 2011-12-05 NOTE — ED Notes (Signed)
Pt here with c/o lower back pain and bladder pressure,nausea and burn,itch that started 1 week ago.denies vomiting.lmp 11/27/11.

## 2011-12-06 LAB — URINE CULTURE

## 2012-01-04 ENCOUNTER — Other Ambulatory Visit (HOSPITAL_COMMUNITY): Payer: Self-pay | Admitting: Internal Medicine

## 2012-01-04 DIAGNOSIS — R109 Unspecified abdominal pain: Secondary | ICD-10-CM

## 2012-01-05 ENCOUNTER — Ambulatory Visit (HOSPITAL_COMMUNITY)
Admission: RE | Admit: 2012-01-05 | Discharge: 2012-01-05 | Disposition: A | Discharge status: Home / Self Care | Payer: Self-pay | Source: Ambulatory Visit | Attending: Internal Medicine | Admitting: Internal Medicine

## 2012-01-05 DIAGNOSIS — R109 Unspecified abdominal pain: Secondary | ICD-10-CM | POA: Insufficient documentation

## 2012-01-05 DIAGNOSIS — M4310 Spondylolisthesis, site unspecified: Secondary | ICD-10-CM | POA: Insufficient documentation

## 2012-01-05 DIAGNOSIS — N854 Malposition of uterus: Secondary | ICD-10-CM | POA: Insufficient documentation

## 2012-01-06 NOTE — ED Provider Notes (Signed)
Medical screening examination/treatment/procedure(s) were performed by resident physician or non-physician practitioner and as supervising physician I was immediately available for consultation/collaboration.   Barkley Bruns MD.    Linna Hoff, MD 01/06/12 1600

## 2012-07-26 ENCOUNTER — Other Ambulatory Visit (HOSPITAL_COMMUNITY): Payer: Self-pay | Admitting: Internal Medicine

## 2012-08-02 ENCOUNTER — Ambulatory Visit (HOSPITAL_COMMUNITY)
Admission: RE | Admit: 2012-08-02 | Discharge: 2012-08-02 | Disposition: A | Discharge status: Home / Self Care | Payer: Self-pay | Source: Ambulatory Visit | Attending: Internal Medicine | Admitting: Internal Medicine

## 2012-08-02 DIAGNOSIS — Z1231 Encounter for screening mammogram for malignant neoplasm of breast: Secondary | ICD-10-CM

## 2012-08-07 ENCOUNTER — Other Ambulatory Visit: Payer: Self-pay | Admitting: Internal Medicine

## 2012-08-07 DIAGNOSIS — R928 Other abnormal and inconclusive findings on diagnostic imaging of breast: Secondary | ICD-10-CM

## 2012-08-28 ENCOUNTER — Encounter (HOSPITAL_COMMUNITY): Payer: Self-pay

## 2012-08-28 ENCOUNTER — Ambulatory Visit (HOSPITAL_COMMUNITY)
Admission: RE | Admit: 2012-08-28 | Discharge: 2012-08-28 | Disposition: A | Discharge status: Home / Self Care | Payer: Self-pay | Source: Ambulatory Visit | Attending: Obstetrics and Gynecology | Admitting: Obstetrics and Gynecology

## 2012-08-28 VITALS — BP 118/76 | Temp 98.1°F | Ht 64.0 in | Wt 172.2 lb

## 2012-08-28 DIAGNOSIS — Z1239 Encounter for other screening for malignant neoplasm of breast: Secondary | ICD-10-CM

## 2012-08-28 NOTE — Patient Instructions (Addendum)
Taught patient how to perform BSE. Patient did not need a Pap smear today due to last Pap smear was 11/04/2010. Let her know BCCCP will cover Pap smears every 3 years unless has a history of abnormal Pap smears. Referred patient to the [facility] for right breat diagnostic mammogram and possible right breast ultrasound per recommendation. Appointment scheduled for Thursday, August 30, 2012 at 0940. Patient aware of appointment and will be there. Patient verbalized understanding.

## 2012-08-28 NOTE — Progress Notes (Signed)
Patient referred to [facility] by the [facility] due to needing additional imaging of the right breast. Screening mammogram completed 08/02/2012 at the [facility].  Pap Smear:    Pap smear not completed today. Last Pap smear was 11/04/2010 at [facility] and normal. Per patient has no history of abnormal Pap smears. Last Pap smear result is in EPIC.  Physical exam: Breasts Breasts symmetrical. No skin abnormalities bilateral breasts. No nipple retraction bilateral breasts. No nipple discharge bilateral breasts. No lymphadenopathy. No lumps palpated bilateral breasts. No complaints of pain or tenderness on exam. Referred patient to the [facility] of [facility] for right breat diagnostic mammogram and possible right breast ultrasound per recommendation. Appointment scheduled for Thursday, August 30, 2012 at 0940.     Pelvic/Bimanual No Pap smear completed today since last Pap smear was 11/04/2010. Pap smear not indicated per BCCCP guidelines.

## 2012-08-29 ENCOUNTER — Other Ambulatory Visit: Payer: Self-pay | Admitting: Obstetrics and Gynecology

## 2012-08-29 DIAGNOSIS — R928 Other abnormal and inconclusive findings on diagnostic imaging of breast: Secondary | ICD-10-CM

## 2012-08-30 ENCOUNTER — Ambulatory Visit
Admission: RE | Admit: 2012-08-30 | Discharge: 2012-08-30 | Disposition: A | Discharge status: Home / Self Care | Payer: No Typology Code available for payment source | Source: Ambulatory Visit | Attending: Internal Medicine | Admitting: Internal Medicine

## 2012-08-30 DIAGNOSIS — R928 Other abnormal and inconclusive findings on diagnostic imaging of breast: Secondary | ICD-10-CM

## 2013-06-30 ENCOUNTER — Emergency Department (HOSPITAL_COMMUNITY)
Admission: EM | Admit: 2013-06-30 | Discharge: 2013-06-30 | Disposition: A | Discharge status: Home / Self Care | Payer: No Typology Code available for payment source | Attending: Emergency Medicine | Admitting: Emergency Medicine

## 2013-06-30 ENCOUNTER — Encounter (HOSPITAL_COMMUNITY): Payer: Self-pay | Admitting: Emergency Medicine

## 2013-06-30 DIAGNOSIS — Z79899 Other long term (current) drug therapy: Secondary | ICD-10-CM | POA: Insufficient documentation

## 2013-06-30 DIAGNOSIS — Z3202 Encounter for pregnancy test, result negative: Secondary | ICD-10-CM | POA: Insufficient documentation

## 2013-06-30 DIAGNOSIS — R300 Dysuria: Secondary | ICD-10-CM | POA: Insufficient documentation

## 2013-06-30 DIAGNOSIS — M069 Rheumatoid arthritis, unspecified: Secondary | ICD-10-CM | POA: Insufficient documentation

## 2013-06-30 DIAGNOSIS — K219 Gastro-esophageal reflux disease without esophagitis: Secondary | ICD-10-CM | POA: Insufficient documentation

## 2013-06-30 LAB — COMPREHENSIVE METABOLIC PANEL
ALBUMIN: 3.6 g/dL (ref 3.5–5.2)
ALT: 5 U/L (ref 0–35)
AST: 5 U/L (ref 0–37)
Alkaline Phosphatase: 45 U/L (ref 39–117)
BUN: 14 mg/dL (ref 6–23)
CALCIUM: 8.4 mg/dL (ref 8.4–10.5)
CO2: 22 mEq/L (ref 19–32)
CREATININE: 0.78 mg/dL (ref 0.50–1.10)
Chloride: 104 mEq/L (ref 96–112)
GFR calc Af Amer: >90 mL/min (ref >90)
GFR calc non Af Amer: >90 mL/min (ref >90)
Glucose, Bld: 119 mg/dL — ABNORMAL HIGH (ref 70–99)
Potassium: 3.9 mEq/L (ref 3.7–5.3)
SODIUM: 138 meq/L (ref 137–147)
Total Bilirubin: <0.2 mg/dL — ABNORMAL LOW (ref 0.3–1.2)
Total Protein: 7.3 g/dL (ref 6.0–8.3)

## 2013-06-30 LAB — URINE MICROSCOPIC-ADD ON

## 2013-06-30 LAB — CBC WITH DIFFERENTIAL/PLATELET
BASOS ABS: 0 10*3/uL (ref 0.0–0.1)
BASOS PCT: 0 % (ref 0–1)
EOS PCT: 6 % — AB (ref 0–5)
Eosinophils Absolute: 0.5 10*3/uL (ref 0.0–0.7)
HEMATOCRIT: 35.4 % — AB (ref 36.0–46.0)
Hemoglobin: 12 g/dL (ref 12.0–15.0)
Lymphocytes Relative: 33 % (ref 12–46)
Lymphs Abs: 2.6 10*3/uL (ref 0.7–4.0)
MCH: 29.9 pg (ref 26.0–34.0)
MCHC: 33.9 g/dL (ref 30.0–36.0)
MCV: 88.1 fL (ref 78.0–100.0)
MONO ABS: 0.9 10*3/uL (ref 0.1–1.0)
Monocytes Relative: 11 % (ref 3–12)
NEUTROS ABS: 3.8 10*3/uL (ref 1.7–7.7)
Neutrophils Relative %: 49 % (ref 43–77)
PLATELETS: 318 10*3/uL (ref 150–400)
RBC: 4.02 MIL/uL (ref 3.87–5.11)
RDW: 13.6 % (ref 11.5–15.5)
WBC: 7.7 10*3/uL (ref 4.0–10.5)

## 2013-06-30 LAB — URINALYSIS, ROUTINE W REFLEX MICROSCOPIC
BILIRUBIN URINE: NEGATIVE
GLUCOSE, UA: NEGATIVE mg/dL
KETONES UR: 15 mg/dL — AB
Nitrite: POSITIVE — AB
PH: 5 (ref 5.0–8.0)
PROTEIN: NEGATIVE mg/dL
Specific Gravity, Urine: 1.025 (ref 1.005–1.030)
Urobilinogen, UA: 1 mg/dL (ref 0.0–1.0)

## 2013-06-30 LAB — PREGNANCY, URINE: Preg Test, Ur: NEGATIVE

## 2013-06-30 MED ORDER — PHENAZOPYRIDINE HCL 200 MG PO TABS: 200.0000 mg | ORAL_TABLET | Freq: Three times a day (TID) | ORAL | Status: DC

## 2013-06-30 NOTE — ED Notes (Signed)
The pt has lower abd pain and bi-lateral flank pain for 3 days.  Nausea with urinary frequency and voiding in small amounts.  lmp  Jan 10th

## 2013-06-30 NOTE — ED Provider Notes (Signed)
CSN: [id number]     Arrival date & time 06/30/13  1927 History   First MD Initiated Contact with Patient 06/30/13 2132     Chief Complaint  Patient presents with  . poss uti    (Consider location/radiation/quality/duration/timing/severity/associated sxs/prior Treatment) HPI Pt reports 2 days of L flank pain, lower abdominal pain and dysuria. No fever or vomiting. Denies vaginal discharge or bleeding. Has had several prior evaluations in the ED for similar in the last 5 years, often with microscopic hematuria but always has neg culture. She has been seen by Urology and Nephro in the past, currently only goes to Nephro yearly for check up.   Past Medical History  Diagnosis Date  . Rheumatic joint disease   . GERD (gastroesophageal reflux disease)    Past Surgical History  Procedure Laterality Date  . Cesarean section      2 previous c sections  . Tubal ligation     Family History  Problem Relation Age of Onset  . Diabetes Father    History  Substance Use Topics  . Smoking status: Never Smoker   . Smokeless tobacco: Never Used  . Alcohol Use: No   OB History   Grav Para Term Preterm Abortions TAB SAB Ect Mult Living   3 2 2  1 1    2      Review of Systems All other systems reviewed and are negative except as noted in HPI.   Allergies  Review of patient's allergies indicates no known allergies.  Home Medications   Current Outpatient Rx  Name  Route  Sig  Dispense  Refill  . adalimumab (HUMIRA PEN STARTER) 40 MG/0.8ML injection   Subcutaneous   Inject 40 mg into the skin every 7 (seven) days. On Tuesdays         . ferrous sulfate 325 (65 FE) MG tablet   Oral   Take 325 mg by mouth daily with breakfast.         . folic acid (FOLVITE) 1 MG tablet   Oral   Take 1 mg by mouth daily.           . hydroxychloroquine (PLAQUENIL) 200 MG tablet   Oral   Take 200 mg by mouth 2 (two) times daily.         . methotrexate (RHEUMATREX) 2.5 MG tablet   Oral   Take 20  mg by mouth once a week. On Sundays; Caution:Chemotherapy. Protect from light.         . pantoprazole (PROTONIX) 40 MG tablet   Oral   Take 40 mg by mouth daily.          BP 127/80  Pulse 86  Temp(Src) 99.3 F (37.4 C)  Resp 18  Ht 5\' 3"  (1.6 m)  Wt 177 lb (80.287 kg)  BMI 31.36 kg/m2  SpO2 100%  LMP 06/08/2013 Physical Exam  Nursing note and vitals reviewed. Constitutional: She is oriented to person, place, and time. She appears well-developed and well-nourished.  HENT:  Head: Normocephalic and atraumatic.  Eyes: EOM are normal. Pupils are equal, round, and reactive to light.  Neck: Normal range of motion. Neck supple.  Cardiovascular: Normal rate, normal heart sounds and intact distal pulses.   Pulmonary/Chest: Effort normal and breath sounds normal. She has no wheezes. She has no rales.  Abdominal: Bowel sounds are normal. She exhibits no distension. There is no tenderness.  Musculoskeletal: Normal range of motion. She exhibits no edema and no tenderness.  Neurological:  She is alert and oriented to person, place, and time. She has normal strength. No cranial nerve deficit or sensory deficit.  Skin: Skin is warm and dry. No rash noted.  Psychiatric: She has a normal mood and affect.    ED Course  Procedures (including critical care time) Labs Review Labs Reviewed  URINALYSIS, ROUTINE W REFLEX MICROSCOPIC - Abnormal; Notable for the following:    Color, Urine ORANGE (*)    Hgb urine dipstick LARGE (*)    Ketones, ur 15 (*)    Nitrite POSITIVE (*)    Leukocytes, UA SMALL (*)    All other components within normal limits  CBC WITH DIFFERENTIAL - Abnormal; Notable for the following:    HCT 35.4 (*)    Eosinophils Relative 6 (*)    All other components within normal limits  COMPREHENSIVE METABOLIC PANEL - Abnormal; Notable for the following:    Glucose, Bld 119 (*)    Total Bilirubin <0.2 (*)    All other components within normal limits  URINE MICROSCOPIC-ADD ON -  Abnormal; Notable for the following:    Squamous Epithelial / LPF FEW (*)    All other components within normal limits  URINE CULTURE  PREGNANCY, URINE   Imaging Review No results found.  EKG Interpretation   None       MDM   1. Dysuria     Labs ordered in triage reviewed. Pt with UA similar to several previous results all with neg culture. Unclear cause of symptoms, could be viral UTI vs interstial cystitis but will hold off on treating this time until culture results come back. Advised symptomatic treatment and followup with Urology for further evaluation.    Jahne Krukowski B. Bernette Mayers, MD 06/30/13 2147

## 2013-06-30 NOTE — Discharge Instructions (Signed)
Disuria (Dysuria) Es el trmino que se aplica al trastorno de dolor al [employer]. Hay muchas causas de disuria, pero la ms frecuente es la infeccin del tracto urinario. Un anlisis de [place] puede confirmar si tiene una infeccin. Un cultivo de [place] 2 y [address]. El cultivo de [place] confirma que usted o el nio estn enfermos. Deber concurrir a una visita de control debido a que:  Si le realizaron un cultivo, Doctor, hospital y las recomendaciones para Scientist, research (medical).  Si el cultivo de [place] fue positivo, deber tomar antibiticos o conocer si los antibiticos que le han prescripto son los correctos para su tipo de infeccin.  Si el cultivo es negativo (no hay infeccin del tracto urinario, debern buscar otras causas o habr que suspender los antibiticos. Puede ser que en el da de hoy le hayan hecho anlisis de laboratorio y no se haya hallado infeccin. Si se realizaron Warden/ranger 24 y 48 horas en [employer]. Puede ser que en el da de hoy le hayan tomado radiografas cuyo resultado es normal. No se ha hallado la causa del problema. Las radiografas sern reledas por un radilogo, que se comunicar con usted si encuentra algn resultado adicional. Puede ser que en el da de hoy a usted o a su nio le hayan indicado medicamentos para ayudarlo con su problema hasta que vea al mdico de cabecera. Si mejora, podr consultar con su mdico de cabecera si reapareciera. Si se le han administrado antibiticos (medicamentos que [employer] grmenes), tmelos como se le han indicado Librarian, academic. Si se le han realizado anlisis de Interior and spatial designer, Advice worker. Deje un nmero telefnico para poder contactarlo. Si esto no es posible, averige cmo debe [employer]. INSTRUCCIONES PARA EL CUIDADO DOMICILIARIO  Beba gran cantidad de lquidos. Para adultos, beba 8 vasos de agua o jugo por [employer]. Para nios, reponga  los lquidos como le indique su mdico.  Vacie la vejiga con frecuencia. Evite retener la orina durante largos perodos.  Despus de [employer], las mujeres deben limpiarse desde adelante hacia atrs, usando el papel higinico slo una vez.  Vace la vejiga antes y despus de Management consultant.  Tome todos los medicamentos que le han recetado hasta que la infeccin haya desaparecido. Se sentir mejor en [employer], pero debe tomar TODOS LOS MEDICAMENTOS. Si se le ha administrado Pyridium, problemente la orina sea de un color oscuro. Esto puede hacer que su ropa interior se manche por lo que deber Chemical engineer una Art gallery manager.  Evite la cafena, el t, el alcohol y las bebidas carbonatadas, debido a que tienden a Surveyor, minerals.  En los hombres, el alcohol puede irritar la prstata.  Utilice los medicamentos de venta libre o de prescripcin para Chief Technology Officer, Environmental health practitioner o la [place], segn se lo indique el profesional que lo asiste.  Si el profesional que lo [employer] Corporation pide que concurra a una cita de seguimiento, es importante asistir a ella. No concurrir a la Holiday representative como consecuencia una lesin crnica o [place], dolor, e incapacidad. Si tiene algn problema para asistir a la cita, debe comunicarse con el establecimiento para obtener asistencia. SOLICITE ATENCIN MDICA DE INMEDIATO SI:  Siente dolor en la espalda.  Sube la fiebre.  Si tiene nuseas (ganas de vomitar) o vmitos.  Si el problema no mejora con los medicamentos o [place]. EST SEGURO QUE:  Comprende las instrucciones para el alta mdica.  Controlar  su enfermedad.  Solicitar atencin mdica de inmediato segn las indicaciones. Document Released: 06/05/2007 Document Revised: 08/08/2011 [facility] Patient Information 2014 [place], [place]. Cistitis intersticial (Interstitial Cystitis) La cistitis intersticial (CI) es una enfermedad que causa molestias o dolor en la vejiga y la regin  circundante de la pelvis. Los sntomas pueden ser diferentes de un caso a otro, e incluso en un mismo individuo. Las personas pueden experimentar:  Materials engineer.  Presin.  Sensibilidad.  Intenso dolor en la vejiga y el rea plvica. CAUSAS Debido a que los sntomas y la gravedad varan tanto, los investigadores creen que no se trata de [place], sino de varias enfermedades. Algunos mdicos utilizan el trmino sndrome de vejiga dolorosa para describir los casos con sntomas urinarios dolorosos. Esto puede no satisfacer la definicin ms estricta . El trmino abarca todos los casos del dolor urinario que no se pueden relacionar con otras causas, como infecciones o clculos renales.  SNTOMAS Los sntomas pueden ser:  Necesidad urgente de Geographical information systems officer.  Necesidad frecuente de Geographical information systems officer.  Combinacin de estos sntomas. El dolor puede variar en intensidad a medida que la vejiga llena de [place] o se vaca. Los sntomas en las mujeres suelen empeorar durante la [place]. A veces puede experimentar un dolor durante el coito vaginal. Algunos de sntomas se asemejan a los de la infeccin bacteriana. Las pruebas no muestran la afeccin. Este problema es mucho ms frecuente en mujeres que en hombres.  DIAGNSTICO El diagnstico se basa en:  Dolor relacionado con la vejiga, por lo general junto con los problemas de frecuencia y [place] para Geographical information systems officer.  No se hallan otras enfermedades que podran causar los sntomas.  Las pruebas de diagnstico que ayudan a Sales promotion account executive otras enfermedades:  Anlisis de [place].  Cultivo de [place].  Cistoscopia.  Biopsia de la pared de la vejiga.  Distensin de la vejiga bajo anestesia.  Citologa de la orina.  Exmenes de laboratorio de las secreciones de la prstata. Neomia Dear biopsia es [place] de tejido que puede ser observado bajo un microscopio. Durante una cistoscopia pueden tomarse muestras de la vejiga y Engineer, mining. Una biopsia ayuda a Radiation protection practitioner de la  vejiga. TRATAMIENTO Los cientficos no han encontrado todava una cura para este problema. Los parientes con no mejoran con el tratamiento antibitico. Los mdicos no pueden predecir quin va a responder mejor con dichos tratamientos. Los sntomas pueden desaparecer sin explicacin. La desaparicin de los sntomas puede coincidir con un evento como un cambio en la dieta o un tratamiento. Incluso cuando los sntomas desaparecen, pueden regresar [address], [address], meses o [place].  Debido a que las causas se desconocen, los tratamientos actuales estn dirigidos a Consulting civil engineer sntomas. Muchas personas reciben [place] de uno o una combinacin de tratamientos. A medida que los investigadores aprenden ms [employer], la lista de tratamientos posibles Multimedia programmer. Los pacientes deben discutir las opciones con su mdico. CIRUGA  La ciruga debe considerarse slo si todos los tratamientos disponibles han fallado y Chief Technology Officer es discapacitante. Muchas propuestas y tcnicas son [place]. Cada propuesta tiene sus propias ventajas y complicaciones. Las ventajas y las complicaciones se deben discutir con Engineer, manufacturing systems. Su mdico puede recomendar la consulta a otro urlogo para una segunda opinin. La mayora de los mdicos son renuentes a operar porque el resultado es imprevisible. Algunas personas [employer] sntomas despus de la [place].  Las personas que consideran la posibilidad de una ciruga deben hablar [employer] posibles riesgos y beneficios, efectos secundarios y complicaciones a lo Equities trader  y corto plazo con sus familiares, as como con las personas que ya han sido sometidas al procedimiento. La ciruga requiere anestesia, hospitalizacin y en algunos casos semanas o meses de recuperacin. A medida que la complejidad del procedimiento aumenta, tambin hay posibilidades de complicaciones y fallos. INSTRUCCIONES PARA EL CUIDADO DOMICILIARIO  [employer] Technology, incluso los de [place], tienen efectos  secundarios. Los pacientes siempre deben Science writer a un mdico antes de Dietitian durante un Psychologist, forensic. Slo tome medicamentos de venta libre o recetados para Chief Technology Officer, Dentist o fiebre, como le indica el mdico.  Muchos pacientes sienten que fumar empeora sus sntomas. Se desconocen cmo los subproductos de tabaco que se excretan en la orina pueden afectar la este trastorno. Fumar es la principal causa conocida de cncer de vejiga. Una de las mejores cosas que los fumadores pueden hacer por su vejiga y su estado general de salud es dejar de fumar.  Muchos pacientes sienten que los ejercicios suaves de estiramiento pueden ayudar a Paramedic los sntomas.  Los mtodos varan, pero bsicamente los pacientes deciden vaciar la vejiga en momentos determinados y usar las tcnicas de relajacin y distracciones para mantener el horario establecido. Poco a Academic librarian, tratan de Artist [employer] vaciados programados. Un diario en el que registre los tiempos de vaciamiento suele ser til para seguir el progreso. ASEGURESE DE QUE:   Comprende estas instrucciones.  Controlar su enfermedad.  Solicitar ayuda de inmediato si no mejora o si empeora. Document Released: 09/01/2008 Document Revised: 08/08/2011 [facility] Patient Information 2014 [place], [place].

## 2013-07-02 LAB — URINE CULTURE

## 2013-08-08 ENCOUNTER — Other Ambulatory Visit (HOSPITAL_COMMUNITY): Payer: Self-pay | Admitting: *Deleted

## 2013-08-08 DIAGNOSIS — Z1231 Encounter for screening mammogram for malignant neoplasm of breast: Secondary | ICD-10-CM

## 2013-09-02 ENCOUNTER — Ambulatory Visit (HOSPITAL_COMMUNITY)
Admission: RE | Admit: 2013-09-02 | Discharge: 2013-09-02 | Disposition: A | Discharge status: Home / Self Care | Payer: No Typology Code available for payment source | Source: Ambulatory Visit | Attending: Internal Medicine | Admitting: Internal Medicine

## 2013-09-02 DIAGNOSIS — Z1231 Encounter for screening mammogram for malignant neoplasm of breast: Secondary | ICD-10-CM

## 2013-11-25 ENCOUNTER — Encounter (HOSPITAL_COMMUNITY): Payer: Self-pay

## 2014-01-09 ENCOUNTER — Ambulatory Visit (INDEPENDENT_AMBULATORY_CARE_PROVIDER_SITE_OTHER): Payer: No Typology Code available for payment source | Admitting: Obstetrics & Gynecology

## 2014-01-09 ENCOUNTER — Encounter: Payer: Self-pay | Admitting: Obstetrics & Gynecology

## 2014-01-09 VITALS — BP 123/76 | HR 70 | Wt 169.6 lb

## 2014-01-09 DIAGNOSIS — N898 Other specified noninflammatory disorders of vagina: Secondary | ICD-10-CM

## 2014-01-09 DIAGNOSIS — B3731 Acute candidiasis of vulva and vagina: Secondary | ICD-10-CM

## 2014-01-09 DIAGNOSIS — B373 Candidiasis of vulva and vagina: Secondary | ICD-10-CM

## 2014-01-09 DIAGNOSIS — L293 Anogenital pruritus, unspecified: Secondary | ICD-10-CM

## 2014-01-09 MED ORDER — FLUCONAZOLE 150 MG PO TABS: 150.0000 mg | ORAL_TABLET | Freq: Once | ORAL | Status: DC

## 2014-01-09 NOTE — Progress Notes (Signed)
Patient states that when she was seen for her pap at [facility] she was referred here for bumps on her cervix.

## 2014-01-09 NOTE — Patient Instructions (Signed)
Prueba de Papanicolaou  (Pap Test)  La prueba de Papanicolaou es un procedimiento realizado en una clnica para evaluar las clulas que estn en la superficie del cuello uterino. El cuello uterino se encuentra entre la parte inferior del tero y la parte superior de la vagina. En algunas mujeres, la regin del cuello del tero tiene el potencial de formar clulas cancerosas. Con evaluaciones constantes por su mdico, este tipo de Agricultural consultant.  Si una prueba de Papanicolaou es anormal, es generalmente el resultado de una exposicin previa al virus del Geneticist, molecular (VPH). El VPH es un virus que puede infectar las clulas del cuello uterino y la displasia causa. La displasia es cuando las clulas no se ven normales. Si una mujer ha sido diagnosticada con displasia de alto grado o severa, est en mayor riesgo de desarrollar cncer cervical. Las personas diagnosticadas con displasia de bajo grado deben controlarse con su mdico porque hay una pequea probabilidad de que displasias de bajo grado puedan convertirse en cncer.  INFORME A SU MDICO SOBRE:   Si ha tenido una reciente infeccin de transmisin sexual (ITS).  Las nuevas parejas sexuales que ha tenido.  La historia de resultados de las anteriores pruebas de Papanicolaou anormales.  La historia de los procedimientos cervicales anteriores que haya tenido (colposcopia, biopsia, procedimiento de excisin electroquirrgica [LEEP]).  Las preocupaciones que haya tenido respecto a secreciones vaginales inusuales.  Antecedentes de dolor plvico.  El uso de mtodos anticonceptivos. ANTES DEL PROCEDIMIENTO   Pregntele a su mdico cundo programar la prueba. Es mejor hacerla cuando no est en su perodo si el mdico [place] una esptula de [place] para recoger clulas o coloca las clulas en un portaobjetos de vidrio. Las tcnicas no son tan sensibles durante el ciclo menstrual.  No use duchas vaginales ni tenga relaciones sexuales durante  las 24 horas anteriores al procedimiento.   No use cremas vaginales o tampones durante las 24 horas antes de la prueba.   Vace su vejiga justo antes del procedimiento para disminuir las molestias.  PROCEDIMIENTO  Deber Arsenio Loader en una camilla con los pies en los estribos. Se colocar un instrumento tibio de metal o plstico (espculo) en la vagina. Este instrumento permite al [employer] ver el interior de la vagina y observar el cuello del tero. Luego se [place] un cepillo de plstico pequeo, o una esptula de [place] para [employer]. Esas clulas se colocan en un recipiente para ser examinadas en el laboratorio. Las clulas se estudian en el microscopio. Un especialista determinar si las clulas son normales.  DESPUS DEL PROCEDIMIENTO  Asegrese de [employer] Corporation.Si los resultados son anormales, puede ser necesario que se haga nuevos estudios.  Document Released: 11/02/2007 Document Revised: 08/08/2011 [facility] Patient Information 2015 [place], [place]. This information is not intended to replace advice given to you by your health care provider. Make sure you discuss any questions you have with your health care provider.

## 2014-01-09 NOTE — Progress Notes (Signed)
Patient ID: Haley Norton, female   DOB: [birth year], 43 y.o.   MRN: [id number]  No chief complaint on file.   HPI Haley Norton is a 43 y.o. female.  [id number] Patient's last menstrual period was 12/11/2013. Normal pap done through [facility] 2 mo ago, referred to check red bumps on her cervix  HPI  Past Medical History  Diagnosis Date  . Rheumatic joint disease   . GERD (gastroesophageal reflux disease)     Past Surgical History  Procedure Laterality Date  . Cesarean section      2 previous c sections  . Tubal ligation      Family History  Problem Relation Age of Onset  . Diabetes Father     Social History History  Substance Use Topics  . Smoking status: Never Smoker   . Smokeless tobacco: Never Used  . Alcohol Use: No    No Known Allergies  Current Outpatient Prescriptions  Medication Sig Dispense Refill  . adalimumab (HUMIRA PEN STARTER) 40 MG/0.8ML injection Inject 40 mg into the skin every 7 (seven) days. On Tuesdays      . folic acid (FOLVITE) 1 MG tablet Take 1 mg by mouth daily.        . hydroxychloroquine (PLAQUENIL) 200 MG tablet Take 200 mg by mouth 2 (two) times daily.      . methotrexate (RHEUMATREX) 2.5 MG tablet Take 20 mg by mouth once a week. On Sundays; Caution:Chemotherapy. Protect from light.      . pantoprazole (PROTONIX) 40 MG tablet Take 40 mg by mouth daily.      . phenazopyridine (PYRIDIUM) 200 MG tablet Take 1 tablet (200 mg total) by mouth 3 (three) times daily.  6 tablet  0  . ferrous sulfate 325 (65 FE) MG tablet Take 325 mg by mouth daily with breakfast.      . fluconazole (DIFLUCAN) 150 MG tablet Take 1 tablet (150 mg total) by mouth once.  1 tablet  1   No current facility-administered medications for this visit.    Review of Systems Review of Systems  Constitutional: Negative.   Gastrointestinal: Negative.   Genitourinary: Positive for vaginal discharge (itch ). Negative for menstrual problem and pelvic pain.     Blood pressure 123/76, pulse 70, weight 169 lb 9.6 oz (76.93 kg), last menstrual period 12/11/2013.  Physical Exam Physical Exam  Nursing note and vitals reviewed. Constitutional: She is oriented to person, place, and time. She appears well-developed. No distress.  Pulmonary/Chest: Effort normal.  Genitourinary: Vaginal discharge (small white c/w yeast, wet prep sent) found.  Neurological: She is alert and oriented to person, place, and time.  Skin: Skin is warm and dry.  Psychiatric: She has a normal mood and affect. Her behavior is normal.  2 small pink spots anterior cervix  Data Reviewed Pap result  Assessment    Normal cervix normal pap suspect yeast     Plan    Diflucan, wet prep, pap in 3 years        Haley Norton 01/09/2014, 1:00 PM

## 2014-01-10 LAB — WET PREP, GENITAL
CLUE CELLS WET PREP: NONE SEEN
Trich, Wet Prep: NONE SEEN
WBC WET PREP: NONE SEEN
Yeast Wet Prep HPF POC: NONE SEEN

## 2014-03-31 ENCOUNTER — Encounter: Payer: Self-pay | Admitting: Obstetrics & Gynecology

## 2014-12-10 ENCOUNTER — Other Ambulatory Visit (HOSPITAL_COMMUNITY): Payer: Self-pay | Admitting: Internal Medicine

## 2014-12-10 DIAGNOSIS — Z1231 Encounter for screening mammogram for malignant neoplasm of breast: Secondary | ICD-10-CM

## 2014-12-17 ENCOUNTER — Ambulatory Visit (HOSPITAL_COMMUNITY)
Admission: RE | Admit: 2014-12-17 | Discharge: 2014-12-17 | Disposition: A | Discharge status: Home / Self Care | Payer: Self-pay | Source: Ambulatory Visit | Attending: Internal Medicine | Admitting: Internal Medicine

## 2014-12-17 DIAGNOSIS — Z1231 Encounter for screening mammogram for malignant neoplasm of breast: Secondary | ICD-10-CM

## 2015-04-14 ENCOUNTER — Other Ambulatory Visit (INDEPENDENT_AMBULATORY_CARE_PROVIDER_SITE_OTHER): Payer: Self-pay

## 2015-04-14 DIAGNOSIS — Z79899 Other long term (current) drug therapy: Secondary | ICD-10-CM

## 2015-04-15 LAB — COMPREHENSIVE METABOLIC PANEL
ALT: 12 IU/L (ref 0–32)
AST: 19 IU/L (ref 0–40)
Albumin/Globulin Ratio: 1.3 (ref 1.1–2.5)
Albumin: 3.9 g/dL (ref 3.5–5.5)
Alkaline Phosphatase: 42 IU/L (ref 39–117)
BILIRUBIN TOTAL: 0.6 mg/dL (ref 0.0–1.2)
BUN / CREAT RATIO: 18 (ref 9–23)
BUN: 12 mg/dL (ref 6–24)
CALCIUM: 8.9 mg/dL (ref 8.7–10.2)
CHLORIDE: 104 mmol/L (ref 97–106)
CO2: 20 mmol/L (ref 18–29)
Creatinine, Ser: 0.67 mg/dL (ref 0.57–1.00)
GFR, EST AFRICAN AMERICAN: 125 mL/min/{1.73_m2} (ref >59)
GFR, EST NON AFRICAN AMERICAN: 108 mL/min/{1.73_m2} (ref >59)
GLOBULIN, TOTAL: 3 g/dL (ref 1.5–4.5)
Glucose: 98 mg/dL (ref 65–99)
POTASSIUM: 4.2 mmol/L (ref 3.5–5.2)
SODIUM: 139 mmol/L (ref 136–144)
Total Protein: 6.9 g/dL (ref 6.0–8.5)

## 2015-04-15 LAB — CBC WITH DIFFERENTIAL/PLATELET
BASOS ABS: 0 10*3/uL (ref 0.0–0.2)
BASOS: 0 %
EOS (ABSOLUTE): 0.2 10*3/uL (ref 0.0–0.4)
Eos: 5 %
Hematocrit: 35.6 % (ref 34.0–46.6)
Hemoglobin: 11.7 g/dL (ref 11.1–15.9)
Immature Grans (Abs): 0 10*3/uL (ref 0.0–0.1)
Immature Granulocytes: 0 %
LYMPHS ABS: 1.3 10*3/uL (ref 0.7–3.1)
Lymphs: 31 %
MCH: 29.2 pg (ref 26.6–33.0)
MCHC: 32.9 g/dL (ref 31.5–35.7)
MCV: 89 fL (ref 79–97)
MONOS ABS: 0.6 10*3/uL (ref 0.1–0.9)
Monocytes: 13 %
NEUTROS ABS: 2.1 10*3/uL (ref 1.4–7.0)
Neutrophils: 51 %
PLATELETS: 315 10*3/uL (ref 150–379)
RBC: 4.01 x10E6/uL (ref 3.77–5.28)
RDW: 15.2 % (ref 12.3–15.4)
WBC: 4.3 10*3/uL (ref 3.4–10.8)

## 2015-04-15 NOTE — Progress Notes (Signed)
Quick Note:  04/15/15 Patient notified of lab results. Dr. Cephus Richer office notified too. ______

## 2015-05-26 ENCOUNTER — Other Ambulatory Visit: Payer: Self-pay

## 2015-05-26 DIAGNOSIS — Z79899 Other long term (current) drug therapy: Secondary | ICD-10-CM

## 2015-05-27 LAB — COMPREHENSIVE METABOLIC PANEL
A/G RATIO: 1.4 (ref 1.1–2.5)
ALT: 12 IU/L (ref 0–32)
AST: 17 IU/L (ref 0–40)
Albumin: 4.1 g/dL (ref 3.5–5.5)
Alkaline Phosphatase: 42 IU/L (ref 39–117)
BUN/Creatinine Ratio: 13 (ref 9–23)
BUN: 10 mg/dL (ref 6–24)
Bilirubin Total: 0.3 mg/dL (ref 0.0–1.2)
CALCIUM: 9.3 mg/dL (ref 8.7–10.2)
CHLORIDE: 102 mmol/L (ref 96–106)
CO2: 25 mmol/L (ref 18–29)
Creatinine, Ser: 0.8 mg/dL (ref 0.57–1.00)
GFR calc Af Amer: 104 mL/min/{1.73_m2} (ref >59)
GFR calc non Af Amer: 91 mL/min/{1.73_m2} (ref >59)
Globulin, Total: 3 g/dL (ref 1.5–4.5)
Glucose: 98 mg/dL (ref 65–99)
POTASSIUM: 4.5 mmol/L (ref 3.5–5.2)
SODIUM: 143 mmol/L (ref 134–144)
Total Protein: 7.1 g/dL (ref 6.0–8.5)

## 2015-05-27 LAB — CBC WITH DIFFERENTIAL/PLATELET
BASOS: 0 %
Basophils Absolute: 0 10*3/uL (ref 0.0–0.2)
EOS (ABSOLUTE): 0.2 10*3/uL (ref 0.0–0.4)
Eos: 5 %
HEMATOCRIT: 38.2 % (ref 34.0–46.6)
Hemoglobin: 12.4 g/dL (ref 11.1–15.9)
IMMATURE GRANULOCYTES: 0 %
Immature Grans (Abs): 0 10*3/uL (ref 0.0–0.1)
LYMPHS ABS: 1.5 10*3/uL (ref 0.7–3.1)
LYMPHS: 35 %
MCH: 29.1 pg (ref 26.6–33.0)
MCHC: 32.5 g/dL (ref 31.5–35.7)
MCV: 90 fL (ref 79–97)
MONOS ABS: 0.5 10*3/uL (ref 0.1–0.9)
Monocytes: 13 %
NEUTROS PCT: 47 %
Neutrophils Absolute: 2 10*3/uL (ref 1.4–7.0)
PLATELETS: 352 10*3/uL (ref 150–379)
RBC: 4.26 x10E6/uL (ref 3.77–5.28)
RDW: 14.9 % (ref 12.3–15.4)
WBC: 4.3 10*3/uL (ref 3.4–10.8)

## 2015-05-27 NOTE — Progress Notes (Signed)
Quick Note:  Front desk is not able to see if the results are there. If results were send to Care Everywhere only physicians have access to it. ______

## 2015-05-27 NOTE — Progress Notes (Signed)
Quick Note:  Called Dr. Cephus Richer office and spoke to Clay about patient's results. Heather to inform doctor. Patient also informed and reminded of TSH labs on 07/07/15 @ 9:00 am ______

## 2015-07-07 ENCOUNTER — Other Ambulatory Visit (INDEPENDENT_AMBULATORY_CARE_PROVIDER_SITE_OTHER): Payer: Self-pay

## 2015-07-07 DIAGNOSIS — Z79899 Other long term (current) drug therapy: Secondary | ICD-10-CM

## 2015-07-08 LAB — COMPREHENSIVE METABOLIC PANEL
ALBUMIN: 3.7 g/dL (ref 3.5–5.5)
ALT: 12 IU/L (ref 0–32)
AST: 18 IU/L (ref 0–40)
Albumin/Globulin Ratio: 1.3 (ref 1.1–2.5)
Alkaline Phosphatase: 43 IU/L (ref 39–117)
BILIRUBIN TOTAL: 0.3 mg/dL (ref 0.0–1.2)
BUN/Creatinine Ratio: 16 (ref 9–23)
BUN: 12 mg/dL (ref 6–24)
CHLORIDE: 101 mmol/L (ref 96–106)
CO2: 24 mmol/L (ref 18–29)
Calcium: 8.7 mg/dL (ref 8.7–10.2)
Creatinine, Ser: 0.74 mg/dL (ref 0.57–1.00)
GFR calc Af Amer: 115 mL/min/{1.73_m2} (ref >59)
GFR, EST NON AFRICAN AMERICAN: 100 mL/min/{1.73_m2} (ref >59)
GLUCOSE: 92 mg/dL (ref 65–99)
Globulin, Total: 2.9 g/dL (ref 1.5–4.5)
POTASSIUM: 4.2 mmol/L (ref 3.5–5.2)
SODIUM: 138 mmol/L (ref 134–144)
TOTAL PROTEIN: 6.6 g/dL (ref 6.0–8.5)

## 2015-07-08 LAB — CBC WITH DIFFERENTIAL/PLATELET
Basophils Absolute: 0 10*3/uL (ref 0.0–0.2)
Basos: 1 %
EOS (ABSOLUTE): 0.3 10*3/uL (ref 0.0–0.4)
Eos: 7 %
Hematocrit: 35.8 % (ref 34.0–46.6)
Hemoglobin: 11.5 g/dL (ref 11.1–15.9)
IMMATURE GRANULOCYTES: 0 %
Immature Grans (Abs): 0 10*3/uL (ref 0.0–0.1)
Lymphocytes Absolute: 1.5 10*3/uL (ref 0.7–3.1)
Lymphs: 34 %
MCH: 28.5 pg (ref 26.6–33.0)
MCHC: 32.1 g/dL (ref 31.5–35.7)
MCV: 89 fL (ref 79–97)
MONOS ABS: 0.4 10*3/uL (ref 0.1–0.9)
Monocytes: 10 %
NEUTROS PCT: 48 %
Neutrophils Absolute: 2.1 10*3/uL (ref 1.4–7.0)
PLATELETS: 373 10*3/uL (ref 150–379)
RBC: 4.03 x10E6/uL (ref 3.77–5.28)
RDW: 14.8 % (ref 12.3–15.4)
WBC: 4.3 10*3/uL (ref 3.4–10.8)

## 2015-08-11 ENCOUNTER — Telehealth: Payer: Self-pay | Admitting: Internal Medicine

## 2015-08-11 MED ORDER — MONTELUKAST SODIUM 10 MG PO TABS: 10.0000 mg | ORAL_TABLET | Freq: Every day | ORAL | Status: DC

## 2015-08-11 NOTE — Telephone Encounter (Signed)
rx done #30 with 11 refills 

## 2015-08-11 NOTE — Telephone Encounter (Signed)
Request Refill Authorization from [facility], [address], [address], [place], [place]  [address].  Phone: [phone], Fax: [phone].  Rx needed: MONTELUKAST SOD 10 MG TABLET (Substituted for Singulair)

## 2015-08-18 ENCOUNTER — Other Ambulatory Visit (INDEPENDENT_AMBULATORY_CARE_PROVIDER_SITE_OTHER): Payer: Self-pay

## 2015-08-18 DIAGNOSIS — Z79899 Other long term (current) drug therapy: Secondary | ICD-10-CM

## 2015-08-19 LAB — COMPREHENSIVE METABOLIC PANEL
ALT: 11 IU/L (ref 0–32)
AST: 14 IU/L (ref 0–40)
Albumin/Globulin Ratio: 1.3 (ref 1.2–2.2)
Albumin: 4 g/dL (ref 3.5–5.5)
Alkaline Phosphatase: 43 IU/L (ref 39–117)
BUN/Creatinine Ratio: 13 (ref 9–23)
BUN: 9 mg/dL (ref 6–24)
Bilirubin Total: 0.5 mg/dL (ref 0.0–1.2)
CALCIUM: 8.8 mg/dL (ref 8.7–10.2)
CO2: 21 mmol/L (ref 18–29)
Chloride: 101 mmol/L (ref 96–106)
Creatinine, Ser: 0.71 mg/dL (ref 0.57–1.00)
GFR, EST AFRICAN AMERICAN: 121 mL/min/{1.73_m2} (ref >59)
GFR, EST NON AFRICAN AMERICAN: 105 mL/min/{1.73_m2} (ref >59)
GLUCOSE: 89 mg/dL (ref 65–99)
Globulin, Total: 3.1 g/dL (ref 1.5–4.5)
Potassium: 4.3 mmol/L (ref 3.5–5.2)
Sodium: 137 mmol/L (ref 134–144)
TOTAL PROTEIN: 7.1 g/dL (ref 6.0–8.5)

## 2015-08-19 LAB — CBC WITH DIFFERENTIAL/PLATELET
BASOS ABS: 0 10*3/uL (ref 0.0–0.2)
BASOS: 0 %
EOS (ABSOLUTE): 0.2 10*3/uL (ref 0.0–0.4)
Eos: 4 %
HEMOGLOBIN: 11.6 g/dL (ref 11.1–15.9)
Hematocrit: 35.3 % (ref 34.0–46.6)
Immature Grans (Abs): 0 10*3/uL (ref 0.0–0.1)
Immature Granulocytes: 0 %
LYMPHS: 32 %
Lymphocytes Absolute: 1.6 10*3/uL (ref 0.7–3.1)
MCH: 28.6 pg (ref 26.6–33.0)
MCHC: 32.9 g/dL (ref 31.5–35.7)
MCV: 87 fL (ref 79–97)
MONOCYTES: 11 %
Monocytes Absolute: 0.5 10*3/uL (ref 0.1–0.9)
NEUTROS ABS: 2.6 10*3/uL (ref 1.4–7.0)
Neutrophils: 53 %
Platelets: 315 10*3/uL (ref 150–379)
RBC: 4.06 x10E6/uL (ref 3.77–5.28)
RDW: 14.6 % (ref 12.3–15.4)
WBC: 4.9 10*3/uL (ref 3.4–10.8)

## 2015-09-29 ENCOUNTER — Other Ambulatory Visit: Payer: No Typology Code available for payment source

## 2015-10-14 ENCOUNTER — Encounter: Payer: Self-pay | Admitting: Internal Medicine

## 2015-10-14 ENCOUNTER — Ambulatory Visit (INDEPENDENT_AMBULATORY_CARE_PROVIDER_SITE_OTHER): Payer: Self-pay | Admitting: Internal Medicine

## 2015-10-14 VITALS — BP 126/82 | HR 68 | Temp 98.1°F | Resp 16 | Ht 62.0 in | Wt 173.0 lb

## 2015-10-14 DIAGNOSIS — R300 Dysuria: Secondary | ICD-10-CM

## 2015-10-14 DIAGNOSIS — Z79899 Other long term (current) drug therapy: Secondary | ICD-10-CM

## 2015-10-14 LAB — POCT URINALYSIS DIPSTICK
BILIRUBIN UA: NEGATIVE
Glucose, UA: NEGATIVE
Ketones, UA: NEGATIVE
Leukocytes, UA: NEGATIVE
NITRITE UA: NEGATIVE
PH UA: 5
Protein, UA: NEGATIVE
SPEC GRAV UA: 1.025
Urobilinogen, UA: 1

## 2015-10-14 MED ORDER — CIPROFLOXACIN HCL 500 MG PO TABS: 500.0000 mg | ORAL_TABLET | Freq: Two times a day (BID) | ORAL | Status: DC

## 2015-10-14 NOTE — Progress Notes (Signed)
   Subjective:    Patient ID: Haley Norton, female    DOB: Jul 06, 1970, 45 y.o.   MRN: [id number]  HPI   Has had dysuria for a week.  Has also noted strong urine odor.  + frequency with slow urine flow and minimal urine each time.  No gross hematuria.  No fever.  Bilateral flank pain.  Has had nausea as well without vomiting.  Is drinking fluids, but not drinking much.  Is able to eat as well.   Current outpatient prescriptions:  .  adalimumab (HUMIRA PEN STARTER) 40 MG/0.8ML injection, Inject 40 mg into the skin every 14 (fourteen) days. On Tuesdays, Disp: , Rfl:  .  ferrous sulfate 325 (65 FE) MG tablet, Take 325 mg by mouth daily with breakfast., Disp: , Rfl:  .  folic acid (FOLVITE) 1 MG tablet, Take 1 mg by mouth daily.  , Disp: , Rfl:  .  hydroxychloroquine (PLAQUENIL) 200 MG tablet, Take 200 mg by mouth 2 (two) times daily., Disp: , Rfl:  .  methotrexate (RHEUMATREX) 2.5 MG tablet, Take 20 mg by mouth once a week. On Sundays; Caution:Chemotherapy. Protect from light., Disp: , Rfl:  .  montelukast (SINGULAIR) 10 MG tablet, Take 1 tablet (10 mg total) by mouth daily., Disp: 30 tablet, Rfl: 11 .  omeprazole (PRILOSEC) 20 MG capsule, 2 caps by mouth once daily, Disp: , Rfl:  .  ciprofloxacin (CIPRO) 500 MG tablet, Take 1 tablet (500 mg total) by mouth 2 (two) times daily., Disp: 20 tablet, Rfl: 0   Cipro added today after visit   No Known Allergies   Review of Systems     Objective:   Physical Exam  NAD Lungs:  CTA CV:  RRR without murmur or rub, radial pulses normal and equal Abd:  S, Mild suprapubic tenderness, No HSM or masses, no CVA tenderness. +BS        Assessment & Plan:  1.  Possible UTI, not clear if upper UTI.  Cipro 500 mg twice daily for 10 days.  Send for urine culture  2.  Chronic use of DMARDs For RA, nephritis.  Check CBC, CMP every 6 weeks.

## 2015-10-15 LAB — COMPREHENSIVE METABOLIC PANEL
ALK PHOS: 44 IU/L (ref 39–117)
ALT: 12 IU/L (ref 0–32)
AST: 17 IU/L (ref 0–40)
Albumin/Globulin Ratio: 1.3 (ref 1.2–2.2)
Albumin: 4.1 g/dL (ref 3.5–5.5)
BUN/Creatinine Ratio: 13 (ref 9–23)
BUN: 10 mg/dL (ref 6–24)
Bilirubin Total: 0.3 mg/dL (ref 0.0–1.2)
CALCIUM: 8.7 mg/dL (ref 8.7–10.2)
CO2: 23 mmol/L (ref 18–29)
CREATININE: 0.75 mg/dL (ref 0.57–1.00)
Chloride: 103 mmol/L (ref 96–106)
GFR calc Af Amer: 113 mL/min/{1.73_m2} (ref >59)
GFR calc non Af Amer: 98 mL/min/{1.73_m2} (ref >59)
GLOBULIN, TOTAL: 3.1 g/dL (ref 1.5–4.5)
GLUCOSE: 84 mg/dL (ref 65–99)
Potassium: 4.2 mmol/L (ref 3.5–5.2)
Sodium: 139 mmol/L (ref 134–144)
Total Protein: 7.2 g/dL (ref 6.0–8.5)

## 2015-10-15 LAB — CBC WITH DIFFERENTIAL/PLATELET
Basophils Absolute: 0 10*3/uL (ref 0.0–0.2)
Basos: 0 %
EOS (ABSOLUTE): 0.4 10*3/uL (ref 0.0–0.4)
Eos: 6 %
Hematocrit: 33.3 % — ABNORMAL LOW (ref 34.0–46.6)
Hemoglobin: 10.7 g/dL — ABNORMAL LOW (ref 11.1–15.9)
IMMATURE GRANULOCYTES: 0 %
Immature Grans (Abs): 0 10*3/uL (ref 0.0–0.1)
LYMPHS: 40 %
Lymphocytes Absolute: 3 10*3/uL (ref 0.7–3.1)
MCH: 27.3 pg (ref 26.6–33.0)
MCHC: 32.1 g/dL (ref 31.5–35.7)
MCV: 85 fL (ref 79–97)
MONOCYTES: 13 %
Monocytes Absolute: 0.9 10*3/uL (ref 0.1–0.9)
NEUTROS PCT: 41 %
Neutrophils Absolute: 3 10*3/uL (ref 1.4–7.0)
Platelets: 327 10*3/uL (ref 150–379)
RBC: 3.92 x10E6/uL (ref 3.77–5.28)
RDW: 15 % (ref 12.3–15.4)
WBC: 7.4 10*3/uL (ref 3.4–10.8)

## 2015-10-16 LAB — URINE CULTURE: Organism ID, Bacteria: NO GROWTH

## 2015-11-26 ENCOUNTER — Other Ambulatory Visit (INDEPENDENT_AMBULATORY_CARE_PROVIDER_SITE_OTHER): Payer: Self-pay

## 2015-11-26 DIAGNOSIS — Z79899 Other long term (current) drug therapy: Secondary | ICD-10-CM

## 2015-11-26 DIAGNOSIS — N059 Unspecified nephritic syndrome with unspecified morphologic changes: Secondary | ICD-10-CM

## 2015-11-27 ENCOUNTER — Other Ambulatory Visit: Payer: Self-pay | Admitting: Internal Medicine

## 2015-11-27 DIAGNOSIS — Z1231 Encounter for screening mammogram for malignant neoplasm of breast: Secondary | ICD-10-CM

## 2015-11-27 LAB — COMPREHENSIVE METABOLIC PANEL
ALBUMIN: 4.1 g/dL (ref 3.5–5.5)
ALK PHOS: 47 IU/L (ref 39–117)
ALT: 16 IU/L (ref 0–32)
AST: 22 IU/L (ref 0–40)
Albumin/Globulin Ratio: 1.4 (ref 1.2–2.2)
BILIRUBIN TOTAL: 0.5 mg/dL (ref 0.0–1.2)
BUN/Creatinine Ratio: 21 (ref 9–23)
BUN: 18 mg/dL (ref 6–24)
CHLORIDE: 103 mmol/L (ref 96–106)
CO2: 20 mmol/L (ref 18–29)
Calcium: 8.8 mg/dL (ref 8.7–10.2)
Creatinine, Ser: 0.85 mg/dL (ref 0.57–1.00)
GFR calc Af Amer: 97 mL/min/{1.73_m2} (ref >59)
GFR calc non Af Amer: 84 mL/min/{1.73_m2} (ref >59)
GLUCOSE: 87 mg/dL (ref 65–99)
Globulin, Total: 3 g/dL (ref 1.5–4.5)
Potassium: 4.3 mmol/L (ref 3.5–5.2)
Sodium: 140 mmol/L (ref 134–144)
Total Protein: 7.1 g/dL (ref 6.0–8.5)

## 2015-11-27 LAB — CBC WITH DIFFERENTIAL/PLATELET
BASOS ABS: 0 10*3/uL (ref 0.0–0.2)
Basos: 0 %
EOS (ABSOLUTE): 0.3 10*3/uL (ref 0.0–0.4)
Eos: 5 %
Hematocrit: 34.3 % (ref 34.0–46.6)
Hemoglobin: 11.1 g/dL (ref 11.1–15.9)
Immature Grans (Abs): 0 10*3/uL (ref 0.0–0.1)
Immature Granulocytes: 0 %
LYMPHS: 39 %
Lymphocytes Absolute: 2 10*3/uL (ref 0.7–3.1)
MCH: 27.3 pg (ref 26.6–33.0)
MCHC: 32.4 g/dL (ref 31.5–35.7)
MCV: 85 fL (ref 79–97)
Monocytes Absolute: 0.5 10*3/uL (ref 0.1–0.9)
Monocytes: 10 %
Neutrophils Absolute: 2.3 10*3/uL (ref 1.4–7.0)
Neutrophils: 46 %
Platelets: 303 10*3/uL (ref 150–379)
RBC: 4.06 x10E6/uL (ref 3.77–5.28)
RDW: 15.2 % (ref 12.3–15.4)
WBC: 5.1 10*3/uL (ref 3.4–10.8)

## 2015-12-24 ENCOUNTER — Telehealth: Payer: Self-pay | Admitting: Internal Medicine

## 2015-12-24 NOTE — Telephone Encounter (Signed)
Spoke to [place] at [facility]. Dr. Glendell Docker is no longer at [facility] and  Dr. Concepcion Living will be following patient's care from now on. Telephone: [phone]  Fax: [phone]  Labs from 11/23/15 already faxed by Estefania to Dr. Erroll Luna.

## 2016-01-05 ENCOUNTER — Other Ambulatory Visit (INDEPENDENT_AMBULATORY_CARE_PROVIDER_SITE_OTHER): Payer: Self-pay

## 2016-01-05 DIAGNOSIS — Z79899 Other long term (current) drug therapy: Secondary | ICD-10-CM

## 2016-01-06 LAB — COMPREHENSIVE METABOLIC PANEL
ALBUMIN: 3.8 g/dL (ref 3.5–5.5)
ALT: 10 IU/L (ref 0–32)
AST: 20 IU/L (ref 0–40)
Albumin/Globulin Ratio: 1.3 (ref 1.2–2.2)
Alkaline Phosphatase: 41 IU/L (ref 39–117)
BUN / CREAT RATIO: 18 (ref 9–23)
BUN: 13 mg/dL (ref 6–24)
Bilirubin Total: 0.3 mg/dL (ref 0.0–1.2)
CALCIUM: 8.6 mg/dL — AB (ref 8.7–10.2)
CO2: 23 mmol/L (ref 18–29)
CREATININE: 0.74 mg/dL (ref 0.57–1.00)
Chloride: 103 mmol/L (ref 96–106)
GFR, EST AFRICAN AMERICAN: 115 mL/min/{1.73_m2} (ref >59)
GFR, EST NON AFRICAN AMERICAN: 100 mL/min/{1.73_m2} (ref >59)
GLOBULIN, TOTAL: 2.9 g/dL (ref 1.5–4.5)
Glucose: 78 mg/dL (ref 65–99)
Potassium: 4.5 mmol/L (ref 3.5–5.2)
SODIUM: 139 mmol/L (ref 134–144)
TOTAL PROTEIN: 6.7 g/dL (ref 6.0–8.5)

## 2016-01-06 LAB — CBC WITH DIFFERENTIAL/PLATELET
BASOS: 1 %
Basophils Absolute: 0 10*3/uL (ref 0.0–0.2)
EOS (ABSOLUTE): 0.3 10*3/uL (ref 0.0–0.4)
Eos: 7 %
HEMATOCRIT: 34.7 % (ref 34.0–46.6)
HEMOGLOBIN: 11 g/dL — AB (ref 11.1–15.9)
Immature Grans (Abs): 0 10*3/uL (ref 0.0–0.1)
Immature Granulocytes: 0 %
Lymphocytes Absolute: 1.6 10*3/uL (ref 0.7–3.1)
Lymphs: 41 %
MCH: 26.8 pg (ref 26.6–33.0)
MCHC: 31.7 g/dL (ref 31.5–35.7)
MCV: 84 fL (ref 79–97)
MONOCYTES: 10 %
Monocytes Absolute: 0.4 10*3/uL (ref 0.1–0.9)
NEUTROS PCT: 41 %
Neutrophils Absolute: 1.6 10*3/uL (ref 1.4–7.0)
Platelets: 377 10*3/uL (ref 150–379)
RBC: 4.11 x10E6/uL (ref 3.77–5.28)
RDW: 15.6 % — ABNORMAL HIGH (ref 12.3–15.4)
WBC: 3.9 10*3/uL (ref 3.4–10.8)

## 2016-01-07 ENCOUNTER — Other Ambulatory Visit: Payer: No Typology Code available for payment source

## 2016-01-18 ENCOUNTER — Ambulatory Visit
Admission: RE | Admit: 2016-01-18 | Discharge: 2016-01-18 | Disposition: A | Discharge status: Home / Self Care | Payer: No Typology Code available for payment source | Source: Ambulatory Visit | Attending: Internal Medicine | Admitting: Internal Medicine

## 2016-01-18 DIAGNOSIS — Z1231 Encounter for screening mammogram for malignant neoplasm of breast: Secondary | ICD-10-CM

## 2016-02-16 ENCOUNTER — Other Ambulatory Visit (INDEPENDENT_AMBULATORY_CARE_PROVIDER_SITE_OTHER): Payer: Self-pay

## 2016-02-16 DIAGNOSIS — Z79899 Other long term (current) drug therapy: Secondary | ICD-10-CM

## 2016-02-16 DIAGNOSIS — R7309 Other abnormal glucose: Secondary | ICD-10-CM

## 2016-02-17 LAB — COMPREHENSIVE METABOLIC PANEL
ALBUMIN: 4.1 g/dL (ref 3.5–5.5)
ALT: 14 IU/L (ref 0–32)
AST: 22 IU/L (ref 0–40)
Albumin/Globulin Ratio: 1.5 (ref 1.2–2.2)
Alkaline Phosphatase: 37 IU/L — ABNORMAL LOW (ref 39–117)
BUN / CREAT RATIO: 13 (ref 9–23)
BUN: 10 mg/dL (ref 6–24)
Bilirubin Total: 0.3 mg/dL (ref 0.0–1.2)
CALCIUM: 8.5 mg/dL — AB (ref 8.7–10.2)
CO2: 21 mmol/L (ref 18–29)
Chloride: 104 mmol/L (ref 96–106)
Creatinine, Ser: 0.79 mg/dL (ref 0.57–1.00)
GFR calc Af Amer: 105 mL/min/{1.73_m2} (ref >59)
GFR calc non Af Amer: 91 mL/min/{1.73_m2} (ref >59)
GLOBULIN, TOTAL: 2.7 g/dL (ref 1.5–4.5)
Glucose: 86 mg/dL (ref 65–99)
Potassium: 4.1 mmol/L (ref 3.5–5.2)
Sodium: 138 mmol/L (ref 134–144)
Total Protein: 6.8 g/dL (ref 6.0–8.5)

## 2016-02-17 LAB — CBC WITH DIFFERENTIAL/PLATELET
BASOS: 1 %
Basophils Absolute: 0 10*3/uL (ref 0.0–0.2)
EOS (ABSOLUTE): 0.3 10*3/uL (ref 0.0–0.4)
EOS: 6 %
HEMATOCRIT: 33.6 % — AB (ref 34.0–46.6)
Hemoglobin: 10.7 g/dL — ABNORMAL LOW (ref 11.1–15.9)
Immature Grans (Abs): 0 10*3/uL (ref 0.0–0.1)
Immature Granulocytes: 0 %
LYMPHS ABS: 1.6 10*3/uL (ref 0.7–3.1)
Lymphs: 35 %
MCH: 26.7 pg (ref 26.6–33.0)
MCHC: 31.8 g/dL (ref 31.5–35.7)
MCV: 84 fL (ref 79–97)
Monocytes Absolute: 0.7 10*3/uL (ref 0.1–0.9)
Monocytes: 15 %
Neutrophils Absolute: 2.1 10*3/uL (ref 1.4–7.0)
Neutrophils: 43 %
Platelets: 299 10*3/uL (ref 150–379)
RBC: 4.01 x10E6/uL (ref 3.77–5.28)
RDW: 17 % — ABNORMAL HIGH (ref 12.3–15.4)
WBC: 4.7 10*3/uL (ref 3.4–10.8)

## 2016-03-29 ENCOUNTER — Other Ambulatory Visit: Payer: Self-pay

## 2016-03-31 ENCOUNTER — Other Ambulatory Visit (INDEPENDENT_AMBULATORY_CARE_PROVIDER_SITE_OTHER): Payer: Self-pay | Admitting: Internal Medicine

## 2016-03-31 DIAGNOSIS — M069 Rheumatoid arthritis, unspecified: Secondary | ICD-10-CM

## 2016-03-31 DIAGNOSIS — M1289 Other specific arthropathies, not elsewhere classified, multiple sites: Secondary | ICD-10-CM

## 2016-03-31 DIAGNOSIS — Z79899 Other long term (current) drug therapy: Secondary | ICD-10-CM

## 2016-03-31 NOTE — Progress Notes (Signed)
Here for CBC, CMP, Ionized Calcium

## 2016-04-01 LAB — COMPREHENSIVE METABOLIC PANEL
ALBUMIN: 4.1 g/dL (ref 3.5–5.5)
ALT: 14 IU/L (ref 0–32)
AST: 18 IU/L (ref 0–40)
Albumin/Globulin Ratio: 1.3 (ref 1.2–2.2)
Alkaline Phosphatase: 37 IU/L — ABNORMAL LOW (ref 39–117)
BUN/Creatinine Ratio: 18 (ref 9–23)
BUN: 14 mg/dL (ref 6–24)
Bilirubin Total: 0.4 mg/dL (ref 0.0–1.2)
CALCIUM: 8.8 mg/dL (ref 8.7–10.2)
CO2: 23 mmol/L (ref 18–29)
Chloride: 104 mmol/L (ref 96–106)
Creatinine, Ser: 0.8 mg/dL (ref 0.57–1.00)
GFR calc Af Amer: 104 mL/min/{1.73_m2} (ref >59)
GFR calc non Af Amer: 90 mL/min/{1.73_m2} (ref >59)
GLOBULIN, TOTAL: 3.1 g/dL (ref 1.5–4.5)
GLUCOSE: 86 mg/dL (ref 65–99)
Potassium: 4.4 mmol/L (ref 3.5–5.2)
Sodium: 142 mmol/L (ref 134–144)
Total Protein: 7.2 g/dL (ref 6.0–8.5)

## 2016-04-01 LAB — CBC WITH DIFFERENTIAL/PLATELET
BASOS ABS: 0 10*3/uL (ref 0.0–0.2)
Basos: 1 %
EOS (ABSOLUTE): 0.4 10*3/uL (ref 0.0–0.4)
EOS: 12 %
HEMATOCRIT: 33 % — AB (ref 34.0–46.6)
HEMOGLOBIN: 10.4 g/dL — AB (ref 11.1–15.9)
Immature Grans (Abs): 0 10*3/uL (ref 0.0–0.1)
Immature Granulocytes: 0 %
Lymphocytes Absolute: 1.5 10*3/uL (ref 0.7–3.1)
Lymphs: 43 %
MCH: 26.3 pg — ABNORMAL LOW (ref 26.6–33.0)
MCHC: 31.5 g/dL (ref 31.5–35.7)
MCV: 83 fL (ref 79–97)
MONOCYTES: 11 %
Monocytes Absolute: 0.4 10*3/uL (ref 0.1–0.9)
NEUTROS PCT: 33 %
Neutrophils Absolute: 1.1 10*3/uL — ABNORMAL LOW (ref 1.4–7.0)
Platelets: 326 10*3/uL (ref 150–379)
RBC: 3.96 x10E6/uL (ref 3.77–5.28)
RDW: 17 % — ABNORMAL HIGH (ref 12.3–15.4)
WBC: 3.4 10*3/uL (ref 3.4–10.8)

## 2016-04-01 LAB — CALCIUM, IONIZED: CALCIUM ION: 5.1 mg/dL (ref 4.5–5.6)

## 2016-04-15 ENCOUNTER — Telehealth: Payer: Self-pay | Admitting: Internal Medicine

## 2016-04-15 ENCOUNTER — Other Ambulatory Visit: Payer: Self-pay

## 2016-04-15 MED ORDER — OMEPRAZOLE 20 MG PO CPDR: 20.0000 mg | DELAYED_RELEASE_CAPSULE | Freq: Two times a day (BID) | ORAL | 2 refills | Status: DC

## 2016-04-15 NOTE — Telephone Encounter (Signed)
Rx faxed to pharmacy. Inform patient

## 2016-04-15 NOTE — Telephone Encounter (Signed)
Refill Authorization Request receive through fax from [facility] pharmacy requesting refill for omeprazole (PRILOSEC) 20 MG capsule

## 2016-04-20 NOTE — Telephone Encounter (Signed)
Pt aware.

## 2016-04-25 ENCOUNTER — Other Ambulatory Visit: Payer: Self-pay

## 2016-04-25 MED ORDER — OMEPRAZOLE 20 MG PO CPDR: 20.0000 mg | DELAYED_RELEASE_CAPSULE | Freq: Two times a day (BID) | ORAL | 2 refills | Status: DC

## 2016-05-02 ENCOUNTER — Other Ambulatory Visit: Payer: Self-pay

## 2016-05-02 ENCOUNTER — Telehealth: Payer: Self-pay | Admitting: Internal Medicine

## 2016-05-02 MED ORDER — OMEPRAZOLE 20 MG PO CPDR
20.0000 mg | DELAYED_RELEASE_CAPSULE | Freq: Two times a day (BID) | ORAL | 2 refills | Status: DC
Start: 2016-05-02 — End: 2016-10-21

## 2016-05-02 NOTE — Telephone Encounter (Signed)
Rx faxed to pharmacy  

## 2016-05-02 NOTE — Telephone Encounter (Signed)
Patient needs Rx for PRILOSEC 20 mg. Capsule.

## 2016-05-12 ENCOUNTER — Other Ambulatory Visit (INDEPENDENT_AMBULATORY_CARE_PROVIDER_SITE_OTHER): Payer: Self-pay

## 2016-05-12 DIAGNOSIS — Z79899 Other long term (current) drug therapy: Secondary | ICD-10-CM

## 2016-05-13 LAB — COMPREHENSIVE METABOLIC PANEL
ALBUMIN: 4 g/dL (ref 3.5–5.5)
ALT: 13 IU/L (ref 0–32)
AST: 18 IU/L (ref 0–40)
Albumin/Globulin Ratio: 1.4 (ref 1.2–2.2)
Alkaline Phosphatase: 39 IU/L (ref 39–117)
BUN / CREAT RATIO: 20 (ref 9–23)
BUN: 13 mg/dL (ref 6–24)
Bilirubin Total: 0.5 mg/dL (ref 0.0–1.2)
CHLORIDE: 102 mmol/L (ref 96–106)
CO2: 23 mmol/L (ref 18–29)
CREATININE: 0.66 mg/dL (ref 0.57–1.00)
Calcium: 8.7 mg/dL (ref 8.7–10.2)
GFR calc Af Amer: 124 mL/min/{1.73_m2} (ref >59)
GFR calc non Af Amer: 108 mL/min/{1.73_m2} (ref >59)
GLUCOSE: 92 mg/dL (ref 65–99)
Globulin, Total: 2.9 g/dL (ref 1.5–4.5)
Potassium: 4.2 mmol/L (ref 3.5–5.2)
SODIUM: 140 mmol/L (ref 134–144)
Total Protein: 6.9 g/dL (ref 6.0–8.5)

## 2016-05-13 LAB — CBC WITH DIFFERENTIAL/PLATELET
BASOS ABS: 0 10*3/uL (ref 0.0–0.2)
Basos: 0 %
EOS (ABSOLUTE): 0.3 10*3/uL (ref 0.0–0.4)
EOS: 5 %
Hematocrit: 32.6 % — ABNORMAL LOW (ref 34.0–46.6)
Hemoglobin: 10.5 g/dL — ABNORMAL LOW (ref 11.1–15.9)
IMMATURE GRANULOCYTES: 0 %
Immature Grans (Abs): 0 10*3/uL (ref 0.0–0.1)
LYMPHS ABS: 1.7 10*3/uL (ref 0.7–3.1)
Lymphs: 34 %
MCH: 26 pg — ABNORMAL LOW (ref 26.6–33.0)
MCHC: 32.2 g/dL (ref 31.5–35.7)
MCV: 81 fL (ref 79–97)
MONOS ABS: 0.5 10*3/uL (ref 0.1–0.9)
Monocytes: 9 %
Neutrophils Absolute: 2.7 10*3/uL (ref 1.4–7.0)
Neutrophils: 52 %
PLATELETS: 360 10*3/uL (ref 150–379)
RBC: 4.04 x10E6/uL (ref 3.77–5.28)
RDW: 16.2 % — ABNORMAL HIGH (ref 12.3–15.4)
WBC: 5.2 10*3/uL (ref 3.4–10.8)

## 2016-06-23 ENCOUNTER — Other Ambulatory Visit: Payer: Self-pay

## 2016-06-24 ENCOUNTER — Other Ambulatory Visit (INDEPENDENT_AMBULATORY_CARE_PROVIDER_SITE_OTHER): Payer: Self-pay

## 2016-06-24 DIAGNOSIS — Z79899 Other long term (current) drug therapy: Secondary | ICD-10-CM

## 2016-06-25 LAB — CBC WITH DIFFERENTIAL/PLATELET
BASOS ABS: 0 10*3/uL (ref 0.0–0.2)
Basos: 0 %
EOS (ABSOLUTE): 0.3 10*3/uL (ref 0.0–0.4)
Eos: 8 %
HEMOGLOBIN: 10.6 g/dL — AB (ref 11.1–15.9)
Hematocrit: 34.7 % (ref 34.0–46.6)
IMMATURE GRANS (ABS): 0 10*3/uL (ref 0.0–0.1)
IMMATURE GRANULOCYTES: 1 %
Lymphocytes Absolute: 1.6 10*3/uL (ref 0.7–3.1)
Lymphs: 37 %
MCH: 25.2 pg — AB (ref 26.6–33.0)
MCHC: 30.5 g/dL — ABNORMAL LOW (ref 31.5–35.7)
MCV: 83 fL (ref 79–97)
MONOCYTES: 14 %
Monocytes Absolute: 0.6 10*3/uL (ref 0.1–0.9)
NEUTROS ABS: 1.7 10*3/uL (ref 1.4–7.0)
NEUTROS PCT: 40 %
PLATELETS: 355 10*3/uL (ref 150–379)
RBC: 4.2 x10E6/uL (ref 3.77–5.28)
RDW: 16.2 % — ABNORMAL HIGH (ref 12.3–15.4)
WBC: 4.2 10*3/uL (ref 3.4–10.8)

## 2016-06-25 LAB — COMPREHENSIVE METABOLIC PANEL
ALT: 14 IU/L (ref 0–32)
AST: 20 IU/L (ref 0–40)
Albumin/Globulin Ratio: 1.4 (ref 1.2–2.2)
Albumin: 4.1 g/dL (ref 3.5–5.5)
Alkaline Phosphatase: 39 IU/L (ref 39–117)
BILIRUBIN TOTAL: 0.6 mg/dL (ref 0.0–1.2)
BUN/Creatinine Ratio: 18 (ref 9–23)
BUN: 12 mg/dL (ref 6–24)
CALCIUM: 8.6 mg/dL — AB (ref 8.7–10.2)
CHLORIDE: 103 mmol/L (ref 96–106)
CO2: 24 mmol/L (ref 18–29)
Creatinine, Ser: 0.65 mg/dL (ref 0.57–1.00)
GFR calc non Af Amer: 108 mL/min/{1.73_m2} (ref >59)
GFR, EST AFRICAN AMERICAN: 125 mL/min/{1.73_m2} (ref >59)
Globulin, Total: 2.9 g/dL (ref 1.5–4.5)
Glucose: 91 mg/dL (ref 65–99)
Potassium: 4.6 mmol/L (ref 3.5–5.2)
Sodium: 139 mmol/L (ref 134–144)
TOTAL PROTEIN: 7 g/dL (ref 6.0–8.5)

## 2016-07-05 ENCOUNTER — Other Ambulatory Visit: Payer: Self-pay

## 2016-07-06 ENCOUNTER — Other Ambulatory Visit (INDEPENDENT_AMBULATORY_CARE_PROVIDER_SITE_OTHER): Payer: Self-pay

## 2016-07-06 DIAGNOSIS — Z23 Encounter for immunization: Secondary | ICD-10-CM

## 2016-07-08 NOTE — Progress Notes (Signed)
Patient was called and given results of labs.

## 2016-08-05 ENCOUNTER — Other Ambulatory Visit (INDEPENDENT_AMBULATORY_CARE_PROVIDER_SITE_OTHER): Payer: Self-pay | Admitting: Internal Medicine

## 2016-08-05 DIAGNOSIS — Z79899 Other long term (current) drug therapy: Secondary | ICD-10-CM

## 2016-08-06 LAB — COMPREHENSIVE METABOLIC PANEL
ALT: 12 IU/L (ref 0–32)
AST: 17 IU/L (ref 0–40)
Albumin/Globulin Ratio: 1.3 (ref 1.2–2.2)
Albumin: 4 g/dL (ref 3.5–5.5)
Alkaline Phosphatase: 40 IU/L (ref 39–117)
BUN/Creatinine Ratio: 17 (ref 9–23)
BUN: 14 mg/dL (ref 6–24)
Bilirubin Total: 0.4 mg/dL (ref 0.0–1.2)
CALCIUM: 8.8 mg/dL (ref 8.7–10.2)
CO2: 21 mmol/L (ref 18–29)
CREATININE: 0.84 mg/dL (ref 0.57–1.00)
Chloride: 102 mmol/L (ref 96–106)
GFR calc Af Amer: 98 mL/min/{1.73_m2} (ref >59)
GFR, EST NON AFRICAN AMERICAN: 85 mL/min/{1.73_m2} (ref >59)
GLOBULIN, TOTAL: 3.1 g/dL (ref 1.5–4.5)
Glucose: 95 mg/dL (ref 65–99)
Potassium: 4.6 mmol/L (ref 3.5–5.2)
Sodium: 139 mmol/L (ref 134–144)
Total Protein: 7.1 g/dL (ref 6.0–8.5)

## 2016-08-06 LAB — CBC WITH DIFFERENTIAL/PLATELET
Basophils Absolute: 0 10*3/uL (ref 0.0–0.2)
Basos: 0 %
EOS (ABSOLUTE): 0.2 10*3/uL (ref 0.0–0.4)
Eos: 4 %
HEMATOCRIT: 34.2 % (ref 34.0–46.6)
Hemoglobin: 10.9 g/dL — ABNORMAL LOW (ref 11.1–15.9)
IMMATURE GRANS (ABS): 0 10*3/uL (ref 0.0–0.1)
Immature Granulocytes: 0 %
LYMPHS: 38 %
Lymphocytes Absolute: 1.6 10*3/uL (ref 0.7–3.1)
MCH: 25.6 pg — ABNORMAL LOW (ref 26.6–33.0)
MCHC: 31.9 g/dL (ref 31.5–35.7)
MCV: 81 fL (ref 79–97)
Monocytes Absolute: 0.6 10*3/uL (ref 0.1–0.9)
Monocytes: 15 %
Neutrophils Absolute: 1.8 10*3/uL (ref 1.4–7.0)
Neutrophils: 43 %
PLATELETS: 334 10*3/uL (ref 150–379)
RBC: 4.25 x10E6/uL (ref 3.77–5.28)
RDW: 16.4 % — ABNORMAL HIGH (ref 12.3–15.4)
WBC: 4.2 10*3/uL (ref 3.4–10.8)

## 2016-10-10 ENCOUNTER — Other Ambulatory Visit: Payer: Self-pay

## 2016-10-10 MED ORDER — MONTELUKAST SODIUM 10 MG PO TABS: 10.0000 mg | ORAL_TABLET | Freq: Every day | ORAL | 2 refills | Status: DC

## 2016-10-21 ENCOUNTER — Ambulatory Visit (INDEPENDENT_AMBULATORY_CARE_PROVIDER_SITE_OTHER): Payer: Self-pay | Admitting: Internal Medicine

## 2016-10-21 ENCOUNTER — Encounter: Payer: Self-pay | Admitting: Internal Medicine

## 2016-10-21 VITALS — BP 122/82 | HR 70 | Temp 98.1°F | Resp 12 | Ht 62.0 in | Wt 175.0 lb

## 2016-10-21 DIAGNOSIS — J301 Allergic rhinitis due to pollen: Secondary | ICD-10-CM

## 2016-10-21 DIAGNOSIS — Z79899 Other long term (current) drug therapy: Secondary | ICD-10-CM

## 2016-10-21 MED ORDER — FEXOFENADINE HCL 180 MG PO TABS: 180.0000 mg | ORAL_TABLET | Freq: Every day | ORAL | Status: DC

## 2016-10-21 MED ORDER — MONTELUKAST SODIUM 10 MG PO TABS: 10.0000 mg | ORAL_TABLET | Freq: Every day | ORAL | 11 refills | Status: DC

## 2016-10-21 MED ORDER — OMEPRAZOLE 40 MG PO CPDR: DELAYED_RELEASE_CAPSULE | ORAL | Status: DC

## 2016-10-21 MED ORDER — FOLIC ACID 1 MG PO TABS: ORAL_TABLET | ORAL | Status: DC

## 2016-10-21 NOTE — Progress Notes (Signed)
   Subjective:    Patient ID: Haley Norton, female    DOB: 03-25-1971, 46 y.o.   MRN: [id number]  HPI   Cough for 1 week.  Cough is without production.  Eyes are itchy and red at times.   Nose is itchy, but little sneezing and no mucous production. Throat itches at times as well--source of cough per patient.   No fever Has had a headache in the temples bilaterally When lies flat in bed on her back, feels a bit short of breath.  Is having posterior and anterior chest pain with coughing mainly.  Hurts also a bit with deep breathing.  Current Meds  Medication Sig  . adalimumab (HUMIRA PEN STARTER) 40 MG/0.8ML injection Inject 40 mg into the skin every 14 (fourteen) days. On Tuesdays  . ferrous sulfate 325 (65 FE) MG tablet Take 325 mg by mouth daily with breakfast.  . folic acid (FOLVITE) 1 MG tablet Take 1 mg by mouth daily.    . hydroxychloroquine (PLAQUENIL) 200 MG tablet Take 200 mg by mouth 2 (two) times daily.  . Methotrexate, PF, 25 MG/0.4ML SOAJ Inject 25 mg into the skin.  . montelukast (SINGULAIR) 10 MG tablet Take 1 tablet (10 mg total) by mouth daily.  Marland Kitchen omeprazole (PRILOSEC) 20 MG capsule Take 1 capsule (20 mg total) by mouth 2 (two) times daily before a meal. 2 caps by mouth once daily    No Known Allergies     Review of Systems     Objective:   Physical Exam   NAD HEENT:  PERRL, EOMI, mild conjunctival injection bilaterally, TMs pearly gray, nasal mucosa swollen with clear discharge, mild posterior pharyngeal cobbling. Neck:  Supple, No adenopathy, no thyromegaly Chest:  CTA.  Mild tenderness with palpation of anterior chest which reproduces chest pain of which she complains CV:  RRR without murmur or rub.  Radial pulses normal and equal LE:  No edema        Assessment & Plan:  1.  Allergic rhino/conjunctivitis:  Refill Montelukast.  Add Fexofenadine 180 mg daily.  Pick up Flonase OTC as well:  2 sprays each nostril daily until pollen season  done. To call if does not improve or if worsens.  2.  Rheumatoid Arthritis:  On MTX.  Check CBC, CMP.

## 2016-10-21 NOTE — Patient Instructions (Addendum)
Si no mejor con Data processing manager, [place] Flonase 2 sprays cada nariz una vez al dia  Keep windows closed.   Take shoes off at door when coming in from outside

## 2016-10-22 LAB — CBC WITH DIFFERENTIAL/PLATELET
BASOS: 0 %
Basophils Absolute: 0 10*3/uL (ref 0.0–0.2)
EOS (ABSOLUTE): 0.4 10*3/uL (ref 0.0–0.4)
EOS: 6 %
HEMOGLOBIN: 11.5 g/dL (ref 11.1–15.9)
Hematocrit: 35.7 % (ref 34.0–46.6)
Immature Grans (Abs): 0 10*3/uL (ref 0.0–0.1)
Immature Granulocytes: 0 %
LYMPHS ABS: 1.5 10*3/uL (ref 0.7–3.1)
Lymphs: 24 %
MCH: 27.3 pg (ref 26.6–33.0)
MCHC: 32.2 g/dL (ref 31.5–35.7)
MCV: 85 fL (ref 79–97)
MONOCYTES: 14 %
Monocytes Absolute: 0.8 10*3/uL (ref 0.1–0.9)
NEUTROS ABS: 3.4 10*3/uL (ref 1.4–7.0)
Neutrophils: 56 %
Platelets: 308 10*3/uL (ref 150–379)
RBC: 4.22 x10E6/uL (ref 3.77–5.28)
RDW: 18.1 % — ABNORMAL HIGH (ref 12.3–15.4)
WBC: 6.1 10*3/uL (ref 3.4–10.8)

## 2016-10-22 LAB — COMPREHENSIVE METABOLIC PANEL
ALBUMIN: 4 g/dL (ref 3.5–5.5)
ALT: 18 IU/L (ref 0–32)
AST: 23 IU/L (ref 0–40)
Albumin/Globulin Ratio: 1.4 (ref 1.2–2.2)
Alkaline Phosphatase: 38 IU/L — ABNORMAL LOW (ref 39–117)
BUN / CREAT RATIO: 16 (ref 9–23)
BUN: 11 mg/dL (ref 6–24)
Bilirubin Total: 0.4 mg/dL (ref 0.0–1.2)
CALCIUM: 9.2 mg/dL (ref 8.7–10.2)
CO2: 23 mmol/L (ref 18–29)
CREATININE: 0.7 mg/dL (ref 0.57–1.00)
Chloride: 103 mmol/L (ref 96–106)
GFR, EST AFRICAN AMERICAN: 122 mL/min/{1.73_m2} (ref >59)
GFR, EST NON AFRICAN AMERICAN: 106 mL/min/{1.73_m2} (ref >59)
GLOBULIN, TOTAL: 2.9 g/dL (ref 1.5–4.5)
GLUCOSE: 90 mg/dL (ref 65–99)
Potassium: 4 mmol/L (ref 3.5–5.2)
Sodium: 139 mmol/L (ref 134–144)
TOTAL PROTEIN: 6.9 g/dL (ref 6.0–8.5)

## 2016-11-27 ENCOUNTER — Encounter: Payer: Self-pay | Admitting: Internal Medicine

## 2017-02-16 ENCOUNTER — Other Ambulatory Visit (INDEPENDENT_AMBULATORY_CARE_PROVIDER_SITE_OTHER): Payer: Self-pay

## 2017-02-16 DIAGNOSIS — Z79899 Other long term (current) drug therapy: Secondary | ICD-10-CM

## 2017-02-17 LAB — CBC WITH DIFFERENTIAL/PLATELET
BASOS ABS: 0 10*3/uL (ref 0.0–0.2)
Basos: 1 %
EOS (ABSOLUTE): 0.3 10*3/uL (ref 0.0–0.4)
Eos: 6 %
HEMOGLOBIN: 12.4 g/dL (ref 11.1–15.9)
Hematocrit: 37.9 % (ref 34.0–46.6)
IMMATURE GRANS (ABS): 0 10*3/uL (ref 0.0–0.1)
Immature Granulocytes: 0 %
LYMPHS: 33 %
Lymphocytes Absolute: 1.6 10*3/uL (ref 0.7–3.1)
MCH: 29.8 pg (ref 26.6–33.0)
MCHC: 32.7 g/dL (ref 31.5–35.7)
MCV: 91 fL (ref 79–97)
MONOCYTES: 12 %
Monocytes Absolute: 0.6 10*3/uL (ref 0.1–0.9)
Neutrophils Absolute: 2.4 10*3/uL (ref 1.4–7.0)
Neutrophils: 48 %
Platelets: 300 10*3/uL (ref 150–379)
RBC: 4.16 x10E6/uL (ref 3.77–5.28)
RDW: 14.8 % (ref 12.3–15.4)
WBC: 4.8 10*3/uL (ref 3.4–10.8)

## 2017-02-17 LAB — COMPREHENSIVE METABOLIC PANEL
ALBUMIN: 4 g/dL (ref 3.5–5.5)
ALT: 21 IU/L (ref 0–32)
AST: 26 IU/L (ref 0–40)
Albumin/Globulin Ratio: 1.3 (ref 1.2–2.2)
Alkaline Phosphatase: 43 IU/L (ref 39–117)
BUN / CREAT RATIO: 12 (ref 9–23)
BUN: 10 mg/dL (ref 6–24)
Bilirubin Total: 0.8 mg/dL (ref 0.0–1.2)
CALCIUM: 9 mg/dL (ref 8.7–10.2)
CO2: 24 mmol/L (ref 20–29)
CREATININE: 0.84 mg/dL (ref 0.57–1.00)
Chloride: 104 mmol/L (ref 96–106)
GFR calc Af Amer: 97 mL/min/{1.73_m2} (ref >59)
GFR calc non Af Amer: 84 mL/min/{1.73_m2} (ref >59)
GLUCOSE: 88 mg/dL (ref 65–99)
Globulin, Total: 3.1 g/dL (ref 1.5–4.5)
Potassium: 4.3 mmol/L (ref 3.5–5.2)
Sodium: 140 mmol/L (ref 134–144)
TOTAL PROTEIN: 7.1 g/dL (ref 6.0–8.5)

## 2017-03-02 ENCOUNTER — Encounter: Payer: Self-pay | Admitting: Internal Medicine

## 2017-03-02 ENCOUNTER — Ambulatory Visit (INDEPENDENT_AMBULATORY_CARE_PROVIDER_SITE_OTHER): Payer: Self-pay | Admitting: Internal Medicine

## 2017-03-02 VITALS — BP 138/88 | HR 74 | Resp 12 | Ht 62.0 in | Wt 173.0 lb

## 2017-03-02 DIAGNOSIS — Z1239 Encounter for other screening for malignant neoplasm of breast: Secondary | ICD-10-CM

## 2017-03-02 DIAGNOSIS — Z1231 Encounter for screening mammogram for malignant neoplasm of breast: Secondary | ICD-10-CM

## 2017-03-02 DIAGNOSIS — M1289 Other specific arthropathies, not elsewhere classified, multiple sites: Secondary | ICD-10-CM

## 2017-03-02 DIAGNOSIS — R300 Dysuria: Secondary | ICD-10-CM

## 2017-03-02 DIAGNOSIS — M069 Rheumatoid arthritis, unspecified: Secondary | ICD-10-CM

## 2017-03-02 DIAGNOSIS — J301 Allergic rhinitis due to pollen: Secondary | ICD-10-CM

## 2017-03-02 LAB — POCT URINALYSIS DIPSTICK
Bilirubin, UA: NEGATIVE
GLUCOSE UA: NEGATIVE
Ketones, UA: NEGATIVE
Leukocytes, UA: NEGATIVE
NITRITE UA: NEGATIVE
Protein, UA: NEGATIVE
Spec Grav, UA: 1.015 (ref 1.010–1.025)
UROBILINOGEN UA: 0.2 U/dL
pH, UA: 5 (ref 5.0–8.0)

## 2017-03-02 MED ORDER — CIPROFLOXACIN HCL 500 MG PO TABS: 500.0000 mg | ORAL_TABLET | Freq: Two times a day (BID) | ORAL | 0 refills | Status: DC

## 2017-03-02 MED ORDER — INFLUENZA VAC SPLIT QUAD 0.5 ML IM SUSY: 0.5000 mL | PREFILLED_SYRINGE | Freq: Once | INTRAMUSCULAR | 0 refills | Status: AC

## 2017-03-02 MED ORDER — INFLUENZA VAC SPLIT QUAD 0.5 ML IM SUSY: 0.5000 mL | PREFILLED_SYRINGE | Freq: Once | INTRAMUSCULAR | 0 refills | Status: DC

## 2017-03-02 NOTE — Patient Instructions (Signed)
habla clinic para PPD en 2 semanas

## 2017-03-02 NOTE — Progress Notes (Signed)
   Subjective:    Patient ID: Haley Norton, female    DOB: [birth year], 46 y.o.   MRN: [id number]  HPI   1.  TB testing:  Patient brought in papers on 02/16/2017 from [facility].  We thought it was just notifying us that she had had TB testing. She states this was an order to have it done.  Discussed we rarely have PPD for our patients, but once yearly, test our staff and that is coming up.  She needs this done in next 6 months.    2.  Rheumatoid Arthritis:  She is to have laboratory every 8 weeks, which has been a standing order for some time, but often patient does not reschedule each lab visit.  Discussed her last labs were fine from 02/16/2017:  CBC, CMP.  She had already had them done, however, on 8/15.2018.  RA is stable. Using injected MTX once weekly. Plaquenil is now 400 mg alternating with 200 mg daily (total) Humira 40 mg every 14 days. States she is getting eye exam, but not every 6 months. Has an appointment on 03/10/2017.  3.  Allergies:  Better.  Using Montelukast and Fexofenadine daily.    4.  Thinks she has UTI:  Nauseated and dysuria starting 3 days ago.  No fever.  No vaginal discharge.  Has not given a urine sample.  Current Meds  Medication Sig  . adalimumab (HUMIRA PEN STARTER) 40 MG/0.8ML injection Inject 40 mg into the skin every 14 (fourteen) days. On Tuesdays  . ferrous sulfate 325 (65 FE) MG tablet Take 325 mg by mouth daily with breakfast.  . fexofenadine (ALLEGRA) 180 MG tablet Take 1 tablet (180 mg total) by mouth daily.  . folic acid (FOLVITE) 1 MG tablet 2 tabs by mouth once daily  . hydroxychloroquine (PLAQUENIL) 200 MG tablet Take 200 mg by mouth. 2 pills every other day and 1 pill every other day  . Methotrexate, PF, 25 MG/0.4ML SOAJ Inject 25 mg into the skin.  . montelukast (SINGULAIR) 10 MG tablet Take 1 tablet (10 mg total) by mouth daily.  Marland Kitchen omeprazole (PRILOSEC) 40 MG capsule 1 cap by mouth on empty stomach once daily    No  Known Allergies  Review of Systems     Objective:   Physical Exam NAD Lungs:  CTA CV: RRR without murmur or rub, radial and DP pulses normal and equal. Abd:  S, Minimal suprapubic tenderness, + BS, No HSM or mass. LE:  No edema       Assessment & Plan:  1.  Rheumatoid Arthritis: stable.  TB testing recommended with her medications.  We do not generally carry PPD, but will be doing our yearly order in next couple of weeks for staff testing and will likely have extra for a 2 step test (she has not had testing in years). Discussed she needs to reappoint in 8 weeks every time she comes in for her CBC, CMP.  She has not been consistent with this for many years. Schedule labs for end of November. She has an appt for eye exam (Plaquenil) Had influenza vaccine at out clinic on 02/16/2017  2.  Allergies:  Controlled  3.  Dysuria:  UA and testing as needed.  4.  HM:  Mammogram scholarship and schedule pap/CPE.

## 2017-03-04 LAB — URINE CULTURE: Organism ID, Bacteria: NO GROWTH

## 2017-04-13 ENCOUNTER — Ambulatory Visit
Admission: RE | Admit: 2017-04-13 | Discharge: 2017-04-13 | Disposition: A | Discharge status: Home / Self Care | Payer: No Typology Code available for payment source | Source: Ambulatory Visit | Attending: Internal Medicine | Admitting: Internal Medicine

## 2017-04-13 DIAGNOSIS — Z1239 Encounter for other screening for malignant neoplasm of breast: Secondary | ICD-10-CM

## 2017-04-20 ENCOUNTER — Ambulatory Visit: Payer: Self-pay | Admitting: Internal Medicine

## 2017-05-12 ENCOUNTER — Ambulatory Visit: Payer: Self-pay | Admitting: Internal Medicine

## 2017-05-12 ENCOUNTER — Encounter: Payer: Self-pay | Admitting: Internal Medicine

## 2017-05-12 VITALS — BP 122/84 | HR 60 | Resp 12 | Ht 62.0 in | Wt 166.0 lb

## 2017-05-12 DIAGNOSIS — Z124 Encounter for screening for malignant neoplasm of cervix: Secondary | ICD-10-CM

## 2017-05-12 DIAGNOSIS — Z79899 Other long term (current) drug therapy: Secondary | ICD-10-CM

## 2017-05-12 DIAGNOSIS — Z0000 Encounter for general adult medical examination without abnormal findings: Secondary | ICD-10-CM

## 2017-05-12 DIAGNOSIS — E785 Hyperlipidemia, unspecified: Secondary | ICD-10-CM

## 2017-05-12 DIAGNOSIS — K029 Dental caries, unspecified: Secondary | ICD-10-CM

## 2017-05-12 NOTE — Patient Instructions (Signed)

## 2017-05-12 NOTE — Progress Notes (Signed)
Subjective:    Patient ID: Haley Norton, female    DOB: 1971-03-20, 46 y.o.   MRN: [id number]  HPI  CPE with pap  1.  Pap:  Last pap 10/2013.  Always normal.  No family history of cervical cancer.  2.  Mammogram:  Last mammogram 04/13/2017.  Always normal.  No family history of breast cancer.  3.  Osteoprevention:  Eats or drinks 3 servings of dairy daily.  Never DXA.  Still having period on and off.  Has had for past 3 months.  Occasional hot flashes.  4.  Guaiac Cards:  Never.  No family history of colon cancer.  5.  Colonoscopy:  Never.  As above.  6.  Immunizations:  Did get influenza vaccin in September.  Is due for Td--not sure if needs to be Tdap.  Do not have immunizations back yet following snowstorm. Needs PPD done as well.  Immunization History  Administered Date(s) Administered  . H1N1 05/27/2008  . Influenza Inj Mdck Quad Pf 07/06/2016  . Influenza Split 02/16/2017  . Influenza Whole 04/25/2005, 03/30/2006, 04/06/2006, 04/10/2008, 02/27/2009  . Pneumococcal Polysaccharide-23 03/30/2005, 04/25/2005  . Td 09/27/2005, 10/25/2005     7.  Glucose/Cholesterol:  Blood glucose has always been normal.  She has a history of dyslipidemia, though not checked since 2011 in this chart.  Past Medical History:  Diagnosis Date  . GERD (gastroesophageal reflux disease)   . Rheumatic joint disease 1997   Renal involvement as well.  Followed by [facility], Dr. Hyman Hopes, Nephrology, and [facility]   Past Surgical History:  Procedure Laterality Date  . CESAREAN SECTION  [id number]   2 previous c sections  . TUBAL LIGATION  2006    Family History  Problem Relation Age of Onset  . Diabetes Father   . Stroke Mother   . Heart disease Mother     Social History   Socioeconomic History  . Marital status: Married    Spouse name: Lynnea Ferrier  . Number of children: 2  . Years of education: 44  . Highest education level: 12th grade  Social Needs  . Financial  resource strain: Not very hard  . Food insecurity - worry: Never true  . Food insecurity - inability: Never true  . Transportation needs - medical: No  . Transportation needs - non-medical: No  Occupational History  . Occupation: Housewife  Tobacco Use  . Smoking status: Never Smoker  . Smokeless tobacco: Never Used  Substance and Sexual Activity  . Alcohol use: No  . Drug use: No  . Sexual activity: Yes    Birth control/protection: Surgical  Other Topics Concern  . Not on file  Social History Narrative   Lives at home with husband and daughter, who is total care with Joellyn Quails Syndrome   Does not have a place to really get out an walk easily in neighborhood.     Does not drive.    Review of Systems  Constitutional: Negative for appetite change, fatigue and fever.  HENT: Positive for dental problem (Filling came out.  Needs dental appointment to treat.). Negative for ear pain, hearing loss, rhinorrhea and sore throat.   Eyes: Negative for itching and visual disturbance (Last eye check at [facility] in October.  Normal exam per patient.).  Respiratory: Negative for cough and shortness of breath.   Cardiovascular: Negative for chest pain, palpitations and leg swelling.  Gastrointestinal: Positive for abdominal pain (sometimes epigastric pain--Omeprazole helps). Negative for blood in stool (No  melena), constipation and diarrhea.  Genitourinary: Negative for dysuria and hematuria (no gross hematuria.  Followed by Dr. Hyman Hopes once yearly.).  Musculoskeletal: Negative for arthralgias (joint discomfort under control with current medications.).  Skin: Negative for rash.  Neurological: Negative for weakness, numbness and headaches.  Hematological: Negative for adenopathy. Does not bruise/bleed easily.  Psychiatric/Behavioral: Negative for dysphoric mood. The patient is not nervous/anxious.        Objective:   Physical Exam  Constitutional: She is oriented to person, place, and time.  She appears well-developed and well-nourished.  HENT:  Head: Normocephalic and atraumatic.  Right Ear: Hearing, tympanic membrane, external ear and ear canal normal.  Left Ear: Hearing, tympanic membrane, external ear and ear canal normal.  Nose: Nose normal.  Mouth/Throat: Uvula is midline, oropharynx is clear and moist and mucous membranes are normal. Dental caries present.  Eyes: Conjunctivae and EOM are normal. Pupils are equal, round, and reactive to light.  Discs sharp bilaterally  Neck: Normal range of motion and full passive range of motion without pain. Neck supple. No thyroid mass and no thyromegaly present.  Cardiovascular: Normal rate, regular rhythm, S1 normal and S2 normal. Exam reveals no S3, no S4 and no friction rub.  No murmur heard. No carotid bruits.  Carotid, radial, femoral, DP and PT pulses normal and equal.   Pulmonary/Chest: Effort normal and breath sounds normal. Right breast exhibits no inverted nipple, no mass, no nipple discharge, no skin change and no tenderness. Left breast exhibits no inverted nipple, no mass, no nipple discharge, no skin change and no tenderness.  Abdominal: Soft. Bowel sounds are normal. She exhibits no mass. There is no hepatosplenomegaly. There is no tenderness. No hernia.  Genitourinary: Rectal exam shows no mass, anal tone normal and guaiac negative stool. No vaginal discharge found.  Genitourinary Comments: Normal external genitalia.  Uterus retroverted, No cervical lesion.  Old blood from os.  No uterine or adnexal mass or tenderness.  Musculoskeletal: Normal range of motion.  Lymphadenopathy:       Head (right side): No submental and no submandibular adenopathy present.       Head (left side): No submental and no submandibular adenopathy present.    She has no cervical adenopathy.    She has no axillary adenopathy.       Right: No inguinal and no supraclavicular adenopathy present.       Left: No inguinal and no supraclavicular  adenopathy present.  Neurological: She is alert and oriented to person, place, and time. She has normal strength and normal reflexes. She displays normal reflexes. No cranial nerve deficit or sensory deficit. Coordination and gait normal.  Skin: Skin is warm and dry. No rash noted.  Psychiatric: She has a normal mood and affect. Her speech is normal and behavior is normal. Judgment and thought content normal. Cognition and memory are normal.          Assessment & Plan:  1.  CPE with pap Will return in January when we will perform PPD with staff for her PPD Tdap and UA in January as well.  Has old blood in vaginal canal that will likely contaminate urine specimen today Stool cards to return in 2 weeks. Mammogram done and normal in November.  2.  RA:  CBC, CMP:  Results to [facility]  3.  Dyslipidemia:  FLP  4.  Obesity:  Encouraged lifestyle changes with eating and physical activity.  Discussed increased weight will cause more problems with joints as she  ages.

## 2017-05-13 LAB — COMPREHENSIVE METABOLIC PANEL
ALK PHOS: 41 IU/L (ref 39–117)
ALT: 13 IU/L (ref 0–32)
AST: 22 IU/L (ref 0–40)
Albumin/Globulin Ratio: 1.6 (ref 1.2–2.2)
Albumin: 4.3 g/dL (ref 3.5–5.5)
BILIRUBIN TOTAL: 0.4 mg/dL (ref 0.0–1.2)
BUN/Creatinine Ratio: 12 (ref 9–23)
BUN: 9 mg/dL (ref 6–24)
CHLORIDE: 105 mmol/L (ref 96–106)
CO2: 25 mmol/L (ref 20–29)
CREATININE: 0.74 mg/dL (ref 0.57–1.00)
Calcium: 9.2 mg/dL (ref 8.7–10.2)
GFR calc Af Amer: 113 mL/min/{1.73_m2} (ref >59)
GFR calc non Af Amer: 98 mL/min/{1.73_m2} (ref >59)
GLUCOSE: 94 mg/dL (ref 65–99)
Globulin, Total: 2.7 g/dL (ref 1.5–4.5)
Potassium: 4.6 mmol/L (ref 3.5–5.2)
Sodium: 141 mmol/L (ref 134–144)
Total Protein: 7 g/dL (ref 6.0–8.5)

## 2017-05-13 LAB — CBC WITH DIFFERENTIAL/PLATELET
BASOS ABS: 0 10*3/uL (ref 0.0–0.2)
Basos: 1 %
EOS (ABSOLUTE): 0.2 10*3/uL (ref 0.0–0.4)
EOS: 6 %
HEMOGLOBIN: 12.6 g/dL (ref 11.1–15.9)
Hematocrit: 39.1 % (ref 34.0–46.6)
IMMATURE GRANULOCYTES: 0 %
Immature Grans (Abs): 0 10*3/uL (ref 0.0–0.1)
Lymphocytes Absolute: 1.3 10*3/uL (ref 0.7–3.1)
Lymphs: 35 %
MCH: 28.3 pg (ref 26.6–33.0)
MCHC: 32.2 g/dL (ref 31.5–35.7)
MCV: 88 fL (ref 79–97)
MONOCYTES: 20 %
Monocytes Absolute: 0.7 10*3/uL (ref 0.1–0.9)
NEUTROS PCT: 38 %
Neutrophils Absolute: 1.4 10*3/uL (ref 1.4–7.0)
PLATELETS: 323 10*3/uL (ref 150–379)
RBC: 4.46 x10E6/uL (ref 3.77–5.28)
RDW: 14.3 % (ref 12.3–15.4)
WBC: 3.6 10*3/uL (ref 3.4–10.8)

## 2017-05-13 LAB — LIPID PANEL W/O CHOL/HDL RATIO
CHOLESTEROL TOTAL: 149 mg/dL (ref 100–199)
HDL: 32 mg/dL — ABNORMAL LOW (ref >39)
LDL CALC: 80 mg/dL (ref 0–99)
TRIGLYCERIDES: 186 mg/dL — AB (ref 0–149)
VLDL CHOLESTEROL CAL: 37 mg/dL (ref 5–40)

## 2017-05-15 LAB — CYTOLOGY - PAP

## 2017-05-31 ENCOUNTER — Telehealth: Payer: Self-pay | Admitting: Internal Medicine

## 2017-05-31 NOTE — Telephone Encounter (Signed)
Patient called stating needs a Rx on omeprazole (PRILOSEC) 40 MG capsule. Please advise.

## 2017-06-01 MED ORDER — OMEPRAZOLE 40 MG PO CPDR: DELAYED_RELEASE_CAPSULE | ORAL | 11 refills | Status: DC

## 2017-06-01 NOTE — Telephone Encounter (Signed)
Please notify Rx was sent to pharmacy

## 2017-06-14 ENCOUNTER — Ambulatory Visit (INDEPENDENT_AMBULATORY_CARE_PROVIDER_SITE_OTHER): Payer: Self-pay

## 2017-06-14 DIAGNOSIS — Z111 Encounter for screening for respiratory tuberculosis: Secondary | ICD-10-CM

## 2017-06-14 MED ORDER — TUBERCULIN PPD 5 UNIT/0.1ML ID SOLN
5.0000 [IU] | Freq: Once | INTRADERMAL | Status: AC
  Administered 2017-06-14: 5 [IU] via INTRADERMAL

## 2017-06-16 ENCOUNTER — Other Ambulatory Visit: Payer: Self-pay

## 2017-06-16 DIAGNOSIS — Z111 Encounter for screening for respiratory tuberculosis: Secondary | ICD-10-CM

## 2017-06-16 LAB — READ PPD: TB Skin Test: NEGATIVE

## 2017-06-26 ENCOUNTER — Other Ambulatory Visit (INDEPENDENT_AMBULATORY_CARE_PROVIDER_SITE_OTHER): Payer: Self-pay

## 2017-06-26 DIAGNOSIS — Z1211 Encounter for screening for malignant neoplasm of colon: Secondary | ICD-10-CM

## 2017-06-26 LAB — POC HEMOCCULT BLD/STL (HOME/3-CARD/SCREEN)
Card #3 Fecal Occult Blood, POC: NEGATIVE
FECAL OCCULT BLD: NEGATIVE
Fecal Occult Blood, POC: NEGATIVE

## 2017-07-07 ENCOUNTER — Other Ambulatory Visit: Payer: Self-pay

## 2017-07-07 DIAGNOSIS — M069 Rheumatoid arthritis, unspecified: Secondary | ICD-10-CM

## 2017-07-07 DIAGNOSIS — Z79899 Other long term (current) drug therapy: Secondary | ICD-10-CM

## 2017-07-07 DIAGNOSIS — M1289 Other specific arthropathies, not elsewhere classified, multiple sites: Secondary | ICD-10-CM

## 2017-07-08 LAB — COMPREHENSIVE METABOLIC PANEL
A/G RATIO: 1.6 (ref 1.2–2.2)
ALT: 14 IU/L (ref 0–32)
AST: 14 IU/L (ref 0–40)
Albumin: 4.4 g/dL (ref 3.5–5.5)
Alkaline Phosphatase: 45 IU/L (ref 39–117)
BILIRUBIN TOTAL: 0.5 mg/dL (ref 0.0–1.2)
BUN/Creatinine Ratio: 17 (ref 9–23)
BUN: 13 mg/dL (ref 6–24)
CHLORIDE: 104 mmol/L (ref 96–106)
CO2: 23 mmol/L (ref 20–29)
Calcium: 9.1 mg/dL (ref 8.7–10.2)
Creatinine, Ser: 0.76 mg/dL (ref 0.57–1.00)
GFR calc Af Amer: 110 mL/min/{1.73_m2} (ref >59)
GFR calc non Af Amer: 95 mL/min/{1.73_m2} (ref >59)
GLUCOSE: 80 mg/dL (ref 65–99)
Globulin, Total: 2.8 g/dL (ref 1.5–4.5)
POTASSIUM: 4.5 mmol/L (ref 3.5–5.2)
Sodium: 140 mmol/L (ref 134–144)
TOTAL PROTEIN: 7.2 g/dL (ref 6.0–8.5)

## 2017-07-08 LAB — CBC WITH DIFFERENTIAL/PLATELET
BASOS ABS: 0.1 10*3/uL (ref 0.0–0.2)
Basos: 1 %
EOS (ABSOLUTE): 0.1 10*3/uL (ref 0.0–0.4)
Eos: 4 %
HEMOGLOBIN: 11.6 g/dL (ref 11.1–15.9)
Hematocrit: 37.1 % (ref 34.0–46.6)
Immature Grans (Abs): 0 10*3/uL (ref 0.0–0.1)
Immature Granulocytes: 0 %
LYMPHS ABS: 1.8 10*3/uL (ref 0.7–3.1)
Lymphs: 48 %
MCH: 27 pg (ref 26.6–33.0)
MCHC: 31.3 g/dL — ABNORMAL LOW (ref 31.5–35.7)
MCV: 86 fL (ref 79–97)
MONOCYTES: 14 %
Monocytes Absolute: 0.5 10*3/uL (ref 0.1–0.9)
NEUTROS ABS: 1.2 10*3/uL — AB (ref 1.4–7.0)
Neutrophils: 33 %
Platelets: 318 10*3/uL (ref 150–379)
RBC: 4.3 x10E6/uL (ref 3.77–5.28)
RDW: 14.2 % (ref 12.3–15.4)
WBC: 3.7 10*3/uL (ref 3.4–10.8)

## 2017-09-04 ENCOUNTER — Other Ambulatory Visit: Payer: Self-pay

## 2017-09-04 DIAGNOSIS — Z79899 Other long term (current) drug therapy: Secondary | ICD-10-CM

## 2017-09-05 LAB — CBC WITH DIFFERENTIAL/PLATELET
BASOS ABS: 0 10*3/uL (ref 0.0–0.2)
Basos: 1 %
EOS (ABSOLUTE): 0.2 10*3/uL (ref 0.0–0.4)
EOS: 6 %
HEMATOCRIT: 36.2 % (ref 34.0–46.6)
HEMOGLOBIN: 11.3 g/dL (ref 11.1–15.9)
IMMATURE GRANS (ABS): 0 10*3/uL (ref 0.0–0.1)
IMMATURE GRANULOCYTES: 0 %
LYMPHS ABS: 1.7 10*3/uL (ref 0.7–3.1)
LYMPHS: 44 %
MCH: 26.2 pg — ABNORMAL LOW (ref 26.6–33.0)
MCHC: 31.2 g/dL — AB (ref 31.5–35.7)
MCV: 84 fL (ref 79–97)
MONOCYTES: 13 %
Monocytes Absolute: 0.5 10*3/uL (ref 0.1–0.9)
NEUTROS PCT: 36 %
Neutrophils Absolute: 1.3 10*3/uL — ABNORMAL LOW (ref 1.4–7.0)
Platelets: 322 10*3/uL (ref 150–379)
RBC: 4.31 x10E6/uL (ref 3.77–5.28)
RDW: 15.4 % (ref 12.3–15.4)
WBC: 3.8 10*3/uL (ref 3.4–10.8)

## 2017-09-05 LAB — COMPREHENSIVE METABOLIC PANEL
ALBUMIN: 4 g/dL (ref 3.5–5.5)
ALK PHOS: 47 IU/L (ref 39–117)
ALT: 20 IU/L (ref 0–32)
AST: 21 IU/L (ref 0–40)
Albumin/Globulin Ratio: 1.4 (ref 1.2–2.2)
BUN / CREAT RATIO: 16 (ref 9–23)
BUN: 12 mg/dL (ref 6–24)
Bilirubin Total: 0.5 mg/dL (ref 0.0–1.2)
CALCIUM: 8.8 mg/dL (ref 8.7–10.2)
CO2: 24 mmol/L (ref 20–29)
CREATININE: 0.74 mg/dL (ref 0.57–1.00)
Chloride: 104 mmol/L (ref 96–106)
GFR calc Af Amer: 113 mL/min/{1.73_m2} (ref >59)
GFR, EST NON AFRICAN AMERICAN: 98 mL/min/{1.73_m2} (ref >59)
GLUCOSE: 88 mg/dL (ref 65–99)
Globulin, Total: 2.9 g/dL (ref 1.5–4.5)
Potassium: 4.3 mmol/L (ref 3.5–5.2)
Sodium: 140 mmol/L (ref 134–144)
Total Protein: 6.9 g/dL (ref 6.0–8.5)

## 2017-11-06 ENCOUNTER — Other Ambulatory Visit: Payer: Self-pay

## 2017-11-06 DIAGNOSIS — Z79899 Other long term (current) drug therapy: Secondary | ICD-10-CM

## 2017-11-06 DIAGNOSIS — M069 Rheumatoid arthritis, unspecified: Secondary | ICD-10-CM

## 2017-11-06 DIAGNOSIS — M1289 Other specific arthropathies, not elsewhere classified, multiple sites: Secondary | ICD-10-CM

## 2017-11-07 LAB — COMPREHENSIVE METABOLIC PANEL
ALBUMIN: 4.2 g/dL (ref 3.5–5.5)
ALK PHOS: 42 IU/L (ref 39–117)
ALT: 10 IU/L (ref 0–32)
AST: 18 IU/L (ref 0–40)
Albumin/Globulin Ratio: 1.7 (ref 1.2–2.2)
BILIRUBIN TOTAL: 0.6 mg/dL (ref 0.0–1.2)
BUN / CREAT RATIO: 21 (ref 9–23)
BUN: 15 mg/dL (ref 6–24)
CO2: 21 mmol/L (ref 20–29)
CREATININE: 0.73 mg/dL (ref 0.57–1.00)
Calcium: 8.8 mg/dL (ref 8.7–10.2)
Chloride: 105 mmol/L (ref 96–106)
GFR calc Af Amer: 115 mL/min/{1.73_m2} (ref >59)
GFR calc non Af Amer: 100 mL/min/{1.73_m2} (ref >59)
GLOBULIN, TOTAL: 2.5 g/dL (ref 1.5–4.5)
Glucose: 85 mg/dL (ref 65–99)
Potassium: 4.2 mmol/L (ref 3.5–5.2)
SODIUM: 139 mmol/L (ref 134–144)
Total Protein: 6.7 g/dL (ref 6.0–8.5)

## 2017-11-07 LAB — CBC WITH DIFFERENTIAL/PLATELET
Basophils Absolute: 0 10*3/uL (ref 0.0–0.2)
Basos: 1 %
EOS (ABSOLUTE): 0.2 10*3/uL (ref 0.0–0.4)
EOS: 5 %
HEMATOCRIT: 36.1 % (ref 34.0–46.6)
HEMOGLOBIN: 11.2 g/dL (ref 11.1–15.9)
Immature Grans (Abs): 0 10*3/uL (ref 0.0–0.1)
Immature Granulocytes: 0 %
LYMPHS ABS: 1.7 10*3/uL (ref 0.7–3.1)
Lymphs: 45 %
MCH: 26.3 pg — AB (ref 26.6–33.0)
MCHC: 31 g/dL — AB (ref 31.5–35.7)
MCV: 85 fL (ref 79–97)
MONOCYTES: 12 %
Monocytes Absolute: 0.5 10*3/uL (ref 0.1–0.9)
NEUTROS ABS: 1.4 10*3/uL (ref 1.4–7.0)
Neutrophils: 37 %
Platelets: 321 10*3/uL (ref 150–450)
RBC: 4.26 x10E6/uL (ref 3.77–5.28)
RDW: 15.1 % (ref 12.3–15.4)
WBC: 3.8 10*3/uL (ref 3.4–10.8)

## 2017-11-09 ENCOUNTER — Ambulatory Visit: Payer: Self-pay | Admitting: Internal Medicine

## 2017-12-08 ENCOUNTER — Ambulatory Visit: Payer: Self-pay | Admitting: Internal Medicine

## 2018-01-01 ENCOUNTER — Other Ambulatory Visit: Payer: Self-pay

## 2018-01-01 DIAGNOSIS — Z79899 Other long term (current) drug therapy: Secondary | ICD-10-CM

## 2018-01-02 LAB — COMPREHENSIVE METABOLIC PANEL
A/G RATIO: 1.5 (ref 1.2–2.2)
ALK PHOS: 44 IU/L (ref 39–117)
ALT: 14 IU/L (ref 0–32)
AST: 16 IU/L (ref 0–40)
Albumin: 4.4 g/dL (ref 3.5–5.5)
BILIRUBIN TOTAL: 0.3 mg/dL (ref 0.0–1.2)
BUN/Creatinine Ratio: 16 (ref 9–23)
BUN: 12 mg/dL (ref 6–24)
CHLORIDE: 105 mmol/L (ref 96–106)
CO2: 23 mmol/L (ref 20–29)
Calcium: 9.1 mg/dL (ref 8.7–10.2)
Creatinine, Ser: 0.73 mg/dL (ref 0.57–1.00)
GFR calc Af Amer: 115 mL/min/{1.73_m2} (ref >59)
GFR calc non Af Amer: 100 mL/min/{1.73_m2} (ref >59)
GLOBULIN, TOTAL: 2.9 g/dL (ref 1.5–4.5)
Glucose: 96 mg/dL (ref 65–99)
POTASSIUM: 4.4 mmol/L (ref 3.5–5.2)
SODIUM: 141 mmol/L (ref 134–144)
Total Protein: 7.3 g/dL (ref 6.0–8.5)

## 2018-01-02 LAB — CBC WITH DIFFERENTIAL/PLATELET
Basophils Absolute: 0 10*3/uL (ref 0.0–0.2)
Basos: 1 %
EOS (ABSOLUTE): 0.3 10*3/uL (ref 0.0–0.4)
EOS: 7 %
HEMATOCRIT: 35.9 % (ref 34.0–46.6)
Hemoglobin: 10.5 g/dL — ABNORMAL LOW (ref 11.1–15.9)
Immature Grans (Abs): 0 10*3/uL (ref 0.0–0.1)
Immature Granulocytes: 0 %
LYMPHS ABS: 1.6 10*3/uL (ref 0.7–3.1)
Lymphs: 41 %
MCH: 24.1 pg — ABNORMAL LOW (ref 26.6–33.0)
MCHC: 29.2 g/dL — AB (ref 31.5–35.7)
MCV: 83 fL (ref 79–97)
MONOS ABS: 0.5 10*3/uL (ref 0.1–0.9)
Monocytes: 13 %
Neutrophils Absolute: 1.5 10*3/uL (ref 1.4–7.0)
Neutrophils: 38 %
Platelets: 352 10*3/uL (ref 150–450)
RBC: 4.35 x10E6/uL (ref 3.77–5.28)
RDW: 14.1 % (ref 12.3–15.4)
WBC: 3.9 10*3/uL (ref 3.4–10.8)

## 2018-01-10 ENCOUNTER — Ambulatory Visit: Payer: Self-pay | Admitting: Internal Medicine

## 2018-01-24 ENCOUNTER — Encounter: Payer: Self-pay | Admitting: Internal Medicine

## 2018-02-14 ENCOUNTER — Ambulatory Visit: Payer: Self-pay | Admitting: Internal Medicine

## 2018-02-14 ENCOUNTER — Encounter: Payer: Self-pay | Admitting: Internal Medicine

## 2018-02-14 VITALS — BP 132/82 | HR 78 | Resp 12 | Ht 62.0 in | Wt 165.0 lb

## 2018-02-14 DIAGNOSIS — R300 Dysuria: Secondary | ICD-10-CM

## 2018-02-14 DIAGNOSIS — M0579 Rheumatoid arthritis with rheumatoid factor of multiple sites without organ or systems involvement: Secondary | ICD-10-CM

## 2018-02-14 DIAGNOSIS — Z79899 Other long term (current) drug therapy: Secondary | ICD-10-CM

## 2018-02-14 DIAGNOSIS — J069 Acute upper respiratory infection, unspecified: Secondary | ICD-10-CM

## 2018-02-14 LAB — POCT URINALYSIS DIPSTICK
BILIRUBIN UA: NEGATIVE
Glucose, UA: NEGATIVE
KETONES UA: NEGATIVE
Leukocytes, UA: NEGATIVE
NITRITE UA: NEGATIVE
PROTEIN UA: NEGATIVE
Spec Grav, UA: <=1.005 — AB (ref 1.010–1.025)
Urobilinogen, UA: 0.2 E.U./dL
pH, UA: 7 (ref 5.0–8.0)

## 2018-02-14 MED ORDER — CIPROFLOXACIN HCL 500 MG PO TABS: ORAL_TABLET | ORAL | 0 refills | Status: DC

## 2018-02-14 NOTE — Progress Notes (Signed)
   Subjective:    Patient ID: Keyana Basham, female    DOB: 05/23/1971, 47 y.o.   MRN: [id number]  HPI   1.  RA:  Followed by Dr. Thapa at [facility].  Recently able to get her financial assistance straightened out.  Apparently she was being billed as they thought her orange card was some sort of insurance.  I spoke with someone from the department about her situation and that the orange card was not insurance a couple of weeks ago.  States her joints are doing fine.   Dr. Thapa recently stopped her MTX both oral and injectable due to GI intolerance.  Actually, stopped 4 months ago.  Last seen there on 01/31/2018 She is continuing on every 14 day dosing of Humira 40 mg SQ Continues with Plaquenil 200 mg once daily alternating with 200 mg twice daily as well. Had her eye exam with ophthalmology ([facility]) on 01/31/2018  Also follows with Dr. Webb twice yearly, last visit in June.  This for a related nephritis.  He reportedly feels she is doing well.  2.  Burning on urination and frequency started 5 days ago over the weekend.  No fever.  No flank pain.  Eating and drinking fluids okay.  Alos with cold symptoms.  Current Meds  Medication Sig  . adalimumab (HUMIRA PEN STARTER) 40 MG/0.8ML injection Inject 40 mg into the skin every 14 (fourteen) days. On Tuesdays  . ferrous sulfate 325 (65 FE) MG tablet Take 325 mg by mouth daily with breakfast.  . fexofenadine (ALLEGRA) 180 MG tablet Take 1 tablet (180 mg total) by mouth daily.  . hydroxychloroquine (PLAQUENIL) 200 MG tablet Take 200 mg by mouth. 2 pills every other day and 1 pill every other day  . montelukast (SINGULAIR) 10 MG tablet Take 1 tablet (10 mg total) by mouth daily.  . omeprazole (PRILOSEC) 40 MG capsule 1 cap by mouth on empty stomach once daily   No Known Allergies  Review of Systems     Objective:   Physical Exam NAD HEENT:  PERRL, EOMI, conjunctivae without injection, TMs pearly  gray, throat without injection.  Nasal mucosa swollen and red. Lungs:  CTA CV:  RRR without murmur or rub.  Radial and DP pulses normal and equal Abd:  S, NT save for over suprapubic area mildly. No flank tenderness.  + BS. No HSM or mass. Skin: no rash. MS:  No synovial thickening of joints       Assessment & Plan:  1.  RA:  Labs drawn:  ESR, CBC, CMET.  Results to Dr Thapa.  2.  Dysuria:  UA with minor findings.  Check urine culture and start Cipro 500 mg twice daily  3.  Mild URI: supportive care.  Push fluids. 

## 2018-02-15 LAB — COMPREHENSIVE METABOLIC PANEL
A/G RATIO: 1.3 (ref 1.2–2.2)
ALBUMIN: 4.3 g/dL (ref 3.5–5.5)
ALK PHOS: 50 IU/L (ref 39–117)
ALT: 12 IU/L (ref 0–32)
AST: 22 IU/L (ref 0–40)
BUN / CREAT RATIO: 15 (ref 9–23)
BUN: 13 mg/dL (ref 6–24)
CHLORIDE: 101 mmol/L (ref 96–106)
CO2: 25 mmol/L (ref 20–29)
Calcium: 9.6 mg/dL (ref 8.7–10.2)
Creatinine, Ser: 0.89 mg/dL (ref 0.57–1.00)
GFR calc non Af Amer: 78 mL/min/{1.73_m2} (ref >59)
GFR, EST AFRICAN AMERICAN: 90 mL/min/{1.73_m2} (ref >59)
Globulin, Total: 3.2 g/dL (ref 1.5–4.5)
Glucose: 98 mg/dL (ref 65–99)
POTASSIUM: 4.4 mmol/L (ref 3.5–5.2)
Sodium: 140 mmol/L (ref 134–144)
TOTAL PROTEIN: 7.5 g/dL (ref 6.0–8.5)

## 2018-02-15 LAB — CBC WITH DIFFERENTIAL/PLATELET
BASOS: 0 %
Basophils Absolute: 0 10*3/uL (ref 0.0–0.2)
EOS (ABSOLUTE): 0.4 10*3/uL (ref 0.0–0.4)
Eos: 6 %
HEMOGLOBIN: 11.1 g/dL (ref 11.1–15.9)
Hematocrit: 34.6 % (ref 34.0–46.6)
IMMATURE GRANS (ABS): 0 10*3/uL (ref 0.0–0.1)
Immature Granulocytes: 0 %
LYMPHS ABS: 2.4 10*3/uL (ref 0.7–3.1)
LYMPHS: 34 %
MCH: 25.3 pg — AB (ref 26.6–33.0)
MCHC: 32.1 g/dL (ref 31.5–35.7)
MCV: 79 fL (ref 79–97)
Monocytes Absolute: 0.5 10*3/uL (ref 0.1–0.9)
Monocytes: 7 %
NEUTROS ABS: 3.6 10*3/uL (ref 1.4–7.0)
Neutrophils: 53 %
PLATELETS: 381 10*3/uL (ref 150–450)
RBC: 4.39 x10E6/uL (ref 3.77–5.28)
RDW: 14.4 % (ref 12.3–15.4)
WBC: 7 10*3/uL (ref 3.4–10.8)

## 2018-02-15 LAB — SEDIMENTATION RATE: Sed Rate: 26 mm/hr (ref 0–32)

## 2018-02-16 LAB — URINE CULTURE

## 2018-06-07 ENCOUNTER — Encounter: Payer: Self-pay | Admitting: Internal Medicine

## 2018-06-07 ENCOUNTER — Ambulatory Visit: Payer: Self-pay | Admitting: Internal Medicine

## 2018-06-07 VITALS — BP 128/80 | HR 62 | Resp 12 | Ht 62.0 in | Wt 166.0 lb

## 2018-06-07 DIAGNOSIS — N898 Other specified noninflammatory disorders of vagina: Secondary | ICD-10-CM

## 2018-06-07 DIAGNOSIS — Z0000 Encounter for general adult medical examination without abnormal findings: Secondary | ICD-10-CM

## 2018-06-07 DIAGNOSIS — R002 Palpitations: Secondary | ICD-10-CM

## 2018-06-07 DIAGNOSIS — M545 Low back pain, unspecified: Secondary | ICD-10-CM

## 2018-06-07 DIAGNOSIS — Z79899 Other long term (current) drug therapy: Secondary | ICD-10-CM

## 2018-06-07 DIAGNOSIS — E785 Hyperlipidemia, unspecified: Secondary | ICD-10-CM

## 2018-06-07 DIAGNOSIS — N951 Menopausal and female climacteric states: Secondary | ICD-10-CM

## 2018-06-07 LAB — POCT WET PREP WITH KOH
KOH PREP POC: NEGATIVE
TRICHOMONAS UA: NEGATIVE
Yeast Wet Prep HPF POC: NEGATIVE

## 2018-06-07 NOTE — Progress Notes (Signed)
Subjective:   Patient ID: Haley Norton, female    DOB: 05-16-1971, 48 y.o.   MRN: [id number]  HPI   CPE without pap  1.  Pap:  Last pap 05/12/2017 normal.  No family history of cervical cancer.  2.  Mammogram: Last 04/13/2017 and normal.  Always normal.  No family history of breast cancer.  Has appt in April for scholarship to get mammogram this year.  3.  Osteoprevention:  2 servings of dairy daily. Walks daily for 15 minutes. She would be willing to increase gradually to 60 minutes daily.    4.  Guaiac Cards:  Last checked this time last year and negative.    5.  Colonoscopy:  Never.  No family history of colon cancer.  6.  Immunizations:  Immunization History  Administered Date(s) Administered  . H1N1 05/27/2008  . Influenza Inj Mdck Quad Pf 07/06/2016, 02/05/2018  . Influenza Split 02/16/2017  . Influenza Whole 04/25/2005, 03/30/2006, 04/06/2006, 04/10/2008, 02/27/2009  . PPD Test 06/14/2017  . Pneumococcal Polysaccharide-23 03/30/2005, 04/25/2005  . Td 09/27/2005, 10/25/2005    7.  Glucose/Cholesterol:  History of dyslipidemia with low HDL and high triglycerides last checked in December 2018.  Fasting today. Glucose always normal.  Current Meds  Medication Sig  . adalimumab (HUMIRA PEN STARTER) 40 MG/0.8ML injection Inject 40 mg into the skin every 14 (fourteen) days. On Tuesdays  . ferrous sulfate 325 (65 FE) MG tablet Take 325 mg by mouth daily with breakfast.  . fexofenadine (ALLEGRA) 180 MG tablet Take 1 tablet (180 mg total) by mouth daily.  . hydroxychloroquine (PLAQUENIL) 200 MG tablet Take 200 mg by mouth. 2 pills every other day and 1 pill every other day  . montelukast (SINGULAIR) 10 MG tablet Take 1 tablet (10 mg total) by mouth daily.  Marland Kitchen. omeprazole (PRILOSEC) 40 MG capsule 1 cap by mouth on empty stomach once daily    No Known Allergies   Past Medical History:  Diagnosis Date  . Chronic kidney disease    Dr. Hyman HopesWebb  . GERD  (gastroesophageal reflux disease)   . Rheumatic joint disease 1997   Renal involvement as well.  [facility], Dr. Hyman HopesWebb, Nephrology, [facility]    Past Surgical History:  Procedure Laterality Date  . CESAREAN SECTION  [phone]   2 previous c sections  . TUBAL LIGATION  2006    Family History  Problem Relation Age of Onset  . Diabetes Father   . Stroke Mother   . Heart disease Mother     Social History   Socioeconomic History  . Marital status: Married    Spouse name: Lynnea FerrierSolomon  . Number of children: 2  . Years of education: 3412  . Highest education level: 12th grade  Occupational History  . Occupation: Housewife  Social Needs  . Financial resource strain: Not very hard  . Food insecurity:    Worry: Never true    Inability: Never true  . Transportation needs:    Medical: No    Non-medical: No  Tobacco Use  . Smoking status: Never Smoker  . Smokeless tobacco: Never Used  Substance and Sexual Activity  . Alcohol use: No  . Drug use: No  . Sexual activity: Yes    Birth control/protection: Surgical  Lifestyle  . Physical activity:    Days per week: 7 days    Minutes per session: 10 min  . Stress: Not at all  Relationships  . Social connections:  Talks on phone: More than three times a week    Gets together: More than three times a week    Attends religious service: More than 4 times per year    Active member of club or organization: No    Attends meetings of clubs or organizations: Never    Relationship status: Married  . Intimate partner violence:    Fear of current or ex partner: No    Emotionally abused: No    Physically abused: No    Forced sexual activity: No  Other Topics Concern  . Not on file  Social History Narrative   Lives at home with husband and daughter, who is total care with Joellyn Quails Syndrome   Does not have a place to really get out an walk easily in neighborhood.     Does not drive.      Review of  Systems  Constitutional: Negative for activity change, appetite change, fatigue and fever.  HENT: Negative for dental problem, ear pain, hearing loss, rhinorrhea, sinus pain and sore throat.   Eyes: Negative for visual disturbance (Has eye check every 6 months while taking Plaquenil.).  Respiratory: Negative for cough and shortness of breath.   Cardiovascular: Positive for palpitations (Sometimes.  Lasts up to 2 minutes.  No associated light headedness.  Not frequent.  ). Negative for chest pain and leg swelling.  Gastrointestinal: Negative for abdominal pain, blood in stool (No melena.), constipation and diarrhea.  Genitourinary: Negative for dysuria.       Vaginal itching and dryness without discharge or odor.  She has not had a period in 4 months.  Symptoms for 2 weeks.  Musculoskeletal: Positive for back pain (Bilateral low back pain for past 2 days.  No history of injury.  She does have to pick up her 80 lb daughter for her daughter's total care.  Notes the pain is worse in the afternoon.  Has tried Tylenol for the pain 500 mg twice with help.  No radiation.).       No numbness, tingling or weakness in legs associated with back pain  Skin: Negative for rash.  Neurological: Negative for weakness and numbness.  Psychiatric/Behavioral: Negative for dysphoric mood. The patient is nervous/anxious (Occasionally feels a bit anxious, but she does not think this is something she needs to address.  ).       Objective:  NAD HEENT:  PERRL, EOMI, TMs pearly gray, throat without injection. Neck:  Supple, No adenopathy, No thyromegaly Chest:  CTA Breasts:  No focal mass, skin dimpling, nipple discharge or axillary adenopathy. CV: RRR with normal S1 and S2, No S3, S4 or murmur.  No carotid bruits.  Carotid, radial, femoral, DP and PT pulses normal and equal. Abd:  S, NT, No HSM or mass, + BS GU:  Normal external female genitalia.  Vaginal mucosa and cervix with friability and atrophic changes.  No  cervical lesions.  No uterine or adnexal mass or tenderness. MS:  Moves all joints well. No destructive or inflammatory changes to joints. NT over spinous processes or paraspinous musculature. Neuro:  A & O x 3, CN II-XII grossly intact.  Motor 5/5 and DTRs 2+/4 throughout.  Sensory to light touch normal.  Gait normal        Assessment & Plan:  1.  CPE without pap Mammogram already scheduled for April  2.  RA: CBC, CMP  3. Dyslipidemia:  FLP  4.  Cervical friability:  Check GC/Chlamydia with normal wet prep/KOH (scant  findings of BV) due to cervical changes--though likely just due to atrophy.   Discussed Astroglide for intercourse.

## 2018-06-07 NOTE — Patient Instructions (Signed)
Try Astroglide or Vagisil Silk for vaginal dryness with intercourse. Or Coconut Oil for intercourse.

## 2018-06-08 LAB — COMPREHENSIVE METABOLIC PANEL
ALBUMIN: 4.1 g/dL (ref 3.5–5.5)
ALK PHOS: 51 IU/L (ref 39–117)
ALT: 13 IU/L (ref 0–32)
AST: 21 IU/L (ref 0–40)
Albumin/Globulin Ratio: 1.3 (ref 1.2–2.2)
BILIRUBIN TOTAL: 0.3 mg/dL (ref 0.0–1.2)
BUN / CREAT RATIO: 16 (ref 9–23)
BUN: 11 mg/dL (ref 6–24)
CHLORIDE: 101 mmol/L (ref 96–106)
CO2: 22 mmol/L (ref 20–29)
CREATININE: 0.69 mg/dL (ref 0.57–1.00)
Calcium: 9 mg/dL (ref 8.7–10.2)
GFR calc Af Amer: 121 mL/min/{1.73_m2} (ref >59)
GFR calc non Af Amer: 105 mL/min/{1.73_m2} (ref >59)
Globulin, Total: 3.1 g/dL (ref 1.5–4.5)
Glucose: 81 mg/dL (ref 65–99)
Potassium: 4.3 mmol/L (ref 3.5–5.2)
SODIUM: 141 mmol/L (ref 134–144)
Total Protein: 7.2 g/dL (ref 6.0–8.5)

## 2018-06-08 LAB — LIPID PANEL W/O CHOL/HDL RATIO
CHOLESTEROL TOTAL: 185 mg/dL (ref 100–199)
HDL: 37 mg/dL — ABNORMAL LOW (ref >39)
LDL CALC: 99 mg/dL (ref 0–99)
TRIGLYCERIDES: 244 mg/dL — AB (ref 0–149)
VLDL CHOLESTEROL CAL: 49 mg/dL — AB (ref 5–40)

## 2018-06-08 LAB — CBC WITH DIFFERENTIAL/PLATELET
BASOS ABS: 0 10*3/uL (ref 0.0–0.2)
Basos: 1 %
EOS (ABSOLUTE): 0.3 10*3/uL (ref 0.0–0.4)
Eos: 7 %
HEMOGLOBIN: 11.2 g/dL (ref 11.1–15.9)
Hematocrit: 35.3 % (ref 34.0–46.6)
Immature Grans (Abs): 0 10*3/uL (ref 0.0–0.1)
Immature Granulocytes: 0 %
LYMPHS ABS: 1.7 10*3/uL (ref 0.7–3.1)
Lymphs: 37 %
MCH: 25.5 pg — ABNORMAL LOW (ref 26.6–33.0)
MCHC: 31.7 g/dL (ref 31.5–35.7)
MCV: 80 fL (ref 79–97)
MONOCYTES: 12 %
Monocytes Absolute: 0.5 10*3/uL (ref 0.1–0.9)
NEUTROS ABS: 1.9 10*3/uL (ref 1.4–7.0)
Neutrophils: 43 %
Platelets: 353 10*3/uL (ref 150–450)
RBC: 4.4 x10E6/uL (ref 3.77–5.28)
RDW: 15.1 % (ref 11.7–15.4)
WBC: 4.5 10*3/uL (ref 3.4–10.8)

## 2018-06-08 LAB — TSH: TSH: 2.1 u[IU]/mL (ref 0.450–4.500)

## 2018-06-11 LAB — GC/CHLAMYDIA PROBE AMP
CHLAMYDIA, DNA PROBE: NEGATIVE
Neisseria gonorrhoeae by PCR: NEGATIVE

## 2018-06-13 ENCOUNTER — Other Ambulatory Visit (HOSPITAL_COMMUNITY): Payer: Self-pay | Admitting: *Deleted

## 2018-06-13 DIAGNOSIS — Z1231 Encounter for screening mammogram for malignant neoplasm of breast: Secondary | ICD-10-CM

## 2018-07-06 ENCOUNTER — Other Ambulatory Visit (INDEPENDENT_AMBULATORY_CARE_PROVIDER_SITE_OTHER): Payer: Self-pay

## 2018-07-06 DIAGNOSIS — Z1211 Encounter for screening for malignant neoplasm of colon: Secondary | ICD-10-CM

## 2018-07-06 LAB — POC HEMOCCULT BLD/STL (HOME/3-CARD/SCREEN)
Card #2 Fecal Occult Blod, POC: NEGATIVE
Card #3 Fecal Occult Blood, POC: NEGATIVE
FECAL OCCULT BLD: NEGATIVE

## 2018-07-11 ENCOUNTER — Telehealth: Payer: Self-pay | Admitting: Internal Medicine

## 2018-07-11 NOTE — Telephone Encounter (Signed)
Pt. Called requesting medication refills for montelukast (SINGULAIR) 10 MG tablet and Omeprazole (PRILOSEC) 40 MG capsule  Please send to [facility] pharmacy and inform pt. After sending Rx

## 2018-07-12 ENCOUNTER — Other Ambulatory Visit: Payer: Self-pay

## 2018-07-12 MED ORDER — MONTELUKAST SODIUM 10 MG PO TABS: 10.0000 mg | ORAL_TABLET | Freq: Every day | ORAL | 11 refills | Status: DC

## 2018-07-12 MED ORDER — OMEPRAZOLE 40 MG PO CPDR: DELAYED_RELEASE_CAPSULE | ORAL | 11 refills | Status: DC

## 2018-07-12 NOTE — Telephone Encounter (Signed)
Please inform patient Rx's sent to [facility]

## 2018-07-12 NOTE — Telephone Encounter (Signed)
Patient informed. 

## 2018-08-15 ENCOUNTER — Other Ambulatory Visit: Payer: Self-pay

## 2018-09-11 ENCOUNTER — Ambulatory Visit (HOSPITAL_COMMUNITY): Payer: No Typology Code available for payment source

## 2018-12-06 ENCOUNTER — Ambulatory Visit: Payer: Self-pay | Admitting: Internal Medicine

## 2019-01-25 ENCOUNTER — Ambulatory Visit
Admission: RE | Admit: 2019-01-25 | Discharge: 2019-01-25 | Disposition: A | Discharge status: Home / Self Care | Payer: No Typology Code available for payment source | Source: Ambulatory Visit | Attending: Obstetrics and Gynecology | Admitting: Obstetrics and Gynecology

## 2019-01-25 ENCOUNTER — Other Ambulatory Visit: Payer: Self-pay

## 2019-01-25 DIAGNOSIS — Z1231 Encounter for screening mammogram for malignant neoplasm of breast: Secondary | ICD-10-CM

## 2019-07-05 ENCOUNTER — Other Ambulatory Visit: Payer: Self-pay

## 2019-07-05 DIAGNOSIS — M0579 Rheumatoid arthritis with rheumatoid factor of multiple sites without organ or systems involvement: Secondary | ICD-10-CM

## 2019-07-06 LAB — COMPREHENSIVE METABOLIC PANEL
ALT: 15 IU/L (ref 0–32)
AST: 20 IU/L (ref 0–40)
Albumin/Globulin Ratio: 1.4 (ref 1.2–2.2)
Albumin: 4.5 g/dL (ref 3.8–4.8)
Alkaline Phosphatase: 62 IU/L (ref 39–117)
BUN/Creatinine Ratio: 12 (ref 9–23)
BUN: 10 mg/dL (ref 6–24)
Bilirubin Total: 0.5 mg/dL (ref 0.0–1.2)
CO2: 25 mmol/L (ref 20–29)
Calcium: 9.6 mg/dL (ref 8.7–10.2)
Chloride: 105 mmol/L (ref 96–106)
Creatinine, Ser: 0.85 mg/dL (ref 0.57–1.00)
GFR calc Af Amer: 94 mL/min/{1.73_m2} (ref >59)
GFR calc non Af Amer: 82 mL/min/{1.73_m2} (ref >59)
Globulin, Total: 3.2 g/dL (ref 1.5–4.5)
Glucose: 94 mg/dL (ref 65–99)
Potassium: 4.4 mmol/L (ref 3.5–5.2)
Sodium: 141 mmol/L (ref 134–144)
Total Protein: 7.7 g/dL (ref 6.0–8.5)

## 2019-07-06 LAB — CBC WITH DIFFERENTIAL/PLATELET
Basophils Absolute: 0.1 10*3/uL (ref 0.0–0.2)
Basos: 2 %
EOS (ABSOLUTE): 0.3 10*3/uL (ref 0.0–0.4)
Eos: 6 %
Hematocrit: 35.5 % (ref 34.0–46.6)
Hemoglobin: 11.1 g/dL (ref 11.1–15.9)
Immature Grans (Abs): 0 10*3/uL (ref 0.0–0.1)
Immature Granulocytes: 0 %
Lymphocytes Absolute: 1.9 10*3/uL (ref 0.7–3.1)
Lymphs: 47 %
MCH: 24 pg — ABNORMAL LOW (ref 26.6–33.0)
MCHC: 31.3 g/dL — ABNORMAL LOW (ref 31.5–35.7)
MCV: 77 fL — ABNORMAL LOW (ref 79–97)
Monocytes Absolute: 0.6 10*3/uL (ref 0.1–0.9)
Monocytes: 14 %
Neutrophils Absolute: 1.3 10*3/uL — ABNORMAL LOW (ref 1.4–7.0)
Neutrophils: 31 %
Platelets: 324 10*3/uL (ref 150–450)
RBC: 4.63 x10E6/uL (ref 3.77–5.28)
RDW: 15.7 % — ABNORMAL HIGH (ref 11.7–15.4)
WBC: 4 10*3/uL (ref 3.4–10.8)

## 2019-08-09 ENCOUNTER — Other Ambulatory Visit: Payer: Self-pay | Admitting: Internal Medicine

## 2019-08-09 NOTE — Progress Notes (Signed)
Adding Ferrous Gluconate 324 mg once daily.

## 2019-08-23 ENCOUNTER — Other Ambulatory Visit: Payer: Self-pay

## 2019-08-23 ENCOUNTER — Other Ambulatory Visit (INDEPENDENT_AMBULATORY_CARE_PROVIDER_SITE_OTHER): Payer: Self-pay

## 2019-08-23 DIAGNOSIS — D649 Anemia, unspecified: Secondary | ICD-10-CM

## 2019-08-24 LAB — IRON AND TIBC
Iron Saturation: 15 % (ref 15–55)
Iron: 58 ug/dL (ref 27–159)
Total Iron Binding Capacity: 389 ug/dL (ref 250–450)
UIBC: 331 ug/dL (ref 131–425)

## 2019-09-06 ENCOUNTER — Other Ambulatory Visit: Payer: Self-pay

## 2019-09-06 MED ORDER — MONTELUKAST SODIUM 10 MG PO TABS: 10.0000 mg | ORAL_TABLET | Freq: Every day | ORAL | 11 refills | Status: DC

## 2019-10-09 ENCOUNTER — Other Ambulatory Visit: Payer: Self-pay

## 2019-10-09 ENCOUNTER — Other Ambulatory Visit (INDEPENDENT_AMBULATORY_CARE_PROVIDER_SITE_OTHER): Payer: Self-pay

## 2019-10-09 DIAGNOSIS — J029 Acute pharyngitis, unspecified: Secondary | ICD-10-CM

## 2019-10-09 DIAGNOSIS — R300 Dysuria: Secondary | ICD-10-CM

## 2019-10-09 LAB — POCT URINALYSIS DIPSTICK
Bilirubin, UA: NEGATIVE
Glucose, UA: NEGATIVE
Ketones, UA: NEGATIVE
Nitrite, UA: NEGATIVE
Protein, UA: NEGATIVE
Spec Grav, UA: 1.025 (ref 1.010–1.025)
Urobilinogen, UA: 0.2 E.U./dL
pH, UA: 7 (ref 5.0–8.0)

## 2019-10-09 LAB — POCT RAPID STREP A (OFFICE): Rapid Strep A Screen: NEGATIVE

## 2019-10-09 MED ORDER — CIPROFLOXACIN HCL 500 MG PO TABS
500.0000 mg | ORAL_TABLET | Freq: Two times a day (BID) | ORAL | 0 refills | Status: DC
Start: 2019-10-09 — End: 2020-12-11

## 2019-10-09 NOTE — Progress Notes (Signed)
Patient with frequency, burning after urinating and low back pain x 5 days. Patient also complalined of a sore throat x 2 weeks. Per Dr. Delrae Alfred run Rapid strep and send for culture. Patient to start Cipro mg 1 twice a day for 3 days. Patient verbalized understanding and Rx sent to [facility] on [address].

## 2019-10-12 LAB — URINE CULTURE: Organism ID, Bacteria: NO GROWTH

## 2019-10-25 ENCOUNTER — Other Ambulatory Visit: Payer: Self-pay

## 2019-11-01 ENCOUNTER — Other Ambulatory Visit: Payer: Self-pay

## 2019-11-01 DIAGNOSIS — D649 Anemia, unspecified: Secondary | ICD-10-CM

## 2019-11-02 LAB — CBC WITH DIFFERENTIAL/PLATELET
Basophils Absolute: 0 10*3/uL (ref 0.0–0.2)
Basos: 1 %
EOS (ABSOLUTE): 0.3 10*3/uL (ref 0.0–0.4)
Eos: 6 %
Hematocrit: 38.8 % (ref 34.0–46.6)
Hemoglobin: 12.6 g/dL (ref 11.1–15.9)
Immature Grans (Abs): 0 10*3/uL (ref 0.0–0.1)
Immature Granulocytes: 0 %
Lymphocytes Absolute: 2 10*3/uL (ref 0.7–3.1)
Lymphs: 45 %
MCH: 27.2 pg (ref 26.6–33.0)
MCHC: 32.5 g/dL (ref 31.5–35.7)
MCV: 84 fL (ref 79–97)
Monocytes Absolute: 0.6 10*3/uL (ref 0.1–0.9)
Monocytes: 12 %
Neutrophils Absolute: 1.6 10*3/uL (ref 1.4–7.0)
Neutrophils: 36 %
Platelets: 303 10*3/uL (ref 150–450)
RBC: 4.63 x10E6/uL (ref 3.77–5.28)
RDW: 15.7 % — ABNORMAL HIGH (ref 11.7–15.4)
WBC: 4.4 10*3/uL (ref 3.4–10.8)

## 2019-12-04 ENCOUNTER — Encounter: Payer: Self-pay | Admitting: Internal Medicine

## 2020-01-02 ENCOUNTER — Other Ambulatory Visit: Payer: Self-pay | Admitting: Obstetrics and Gynecology

## 2020-01-02 DIAGNOSIS — Z1231 Encounter for screening mammogram for malignant neoplasm of breast: Secondary | ICD-10-CM

## 2020-01-15 ENCOUNTER — Telehealth: Payer: Self-pay | Admitting: Internal Medicine

## 2020-01-15 NOTE — Telephone Encounter (Signed)
Patient requesting Rx on omeprazole (PRILOSEC) 40 MG capsule to be called in at [facility].

## 2020-01-17 ENCOUNTER — Other Ambulatory Visit: Payer: Self-pay

## 2020-01-17 MED ORDER — OMEPRAZOLE 40 MG PO CPDR: DELAYED_RELEASE_CAPSULE | ORAL | 11 refills | Status: DC

## 2020-01-17 NOTE — Telephone Encounter (Signed)
Patient informed. 

## 2020-01-17 NOTE — Telephone Encounter (Signed)
Notify patient Rx was sent to pharmacy

## 2020-01-30 ENCOUNTER — Ambulatory Visit: Payer: Self-pay | Admitting: *Deleted

## 2020-01-30 ENCOUNTER — Other Ambulatory Visit: Payer: Self-pay

## 2020-01-30 ENCOUNTER — Ambulatory Visit
Admission: RE | Admit: 2020-01-30 | Discharge: 2020-01-30 | Disposition: A | Discharge status: Home / Self Care | Payer: No Typology Code available for payment source | Source: Ambulatory Visit | Attending: Obstetrics and Gynecology | Admitting: Obstetrics and Gynecology

## 2020-01-30 VITALS — BP 110/85 | Temp 97.3°F | Wt 173.0 lb

## 2020-01-30 DIAGNOSIS — Z1231 Encounter for screening mammogram for malignant neoplasm of breast: Secondary | ICD-10-CM

## 2020-01-30 DIAGNOSIS — Z1239 Encounter for other screening for malignant neoplasm of breast: Secondary | ICD-10-CM

## 2020-01-30 DIAGNOSIS — Z0000 Encounter for general adult medical examination without abnormal findings: Secondary | ICD-10-CM

## 2020-01-30 NOTE — Patient Instructions (Signed)
Explained breast self awareness with Haley Norton. Patient did not need a Pap smear today due to last Pap smear was 05/12/2017. Let her know BCCCP will cover Pap smears every 3 years unless has a history of abnormal Pap smears. Patients next Pap smear is due in December 2021. Patient scheduled for Pap smear at free cervical cancer screening clinic on Monday, May 04, 2020 at 1045. Referred patient to the [facility] of [facility] for a screening mammogram on the mobile unit. Appointment scheduled Thursday, March 12, 2020 at 1000. Patient aware of appointments and will be there. Let patient know the Breast Center will follow up with her within a couple weeks after mammogram with results by letter or phone. Haley Norton verbalized understanding.  Haley Norton, [name], RN 10:03 AM

## 2020-01-30 NOTE — Progress Notes (Addendum)
Haley Norton is a 49 y.o. female who presents to [facility] clinic today with no complaints.    Pap Smear: Pap smear not completed today. Last Pap smear was 05/12/2017 at [facility] clinic and was normal. Per patient has no history of an abnormal Pap smear. Last Pap smear result is available in Epic.   Physical exam: Breasts Breasts symmetrical. No skin abnormalities bilateral breasts. No nipple retraction bilateral breasts. No nipple discharge bilateral breasts. No lymphadenopathy. No lumps palpated bilateral breasts. No complaints of pain or tenderness on exam.       Pelvic/Bimanual Pap is not indicated today per BCCCP guidelines. Patients next Pap smear is due in December 2021. Patient scheduled for Pap smear at free cervical cancer screening clinic on Monday, May 04, 2020 at 1045.   Smoking History: Patient has never smoked.   Patient Navigation: Patient education provided. Access to services provided for patient through [facility] program. Spanish interpreter Natale Lay from [facility] provided.  Colorectal Cancer Screening: Per patient has never had colonoscopy completed. Patient completed FIT Test 07/16/2018 that was negative. No complaints today.    Breast and Cervical Cancer Risk Assessment: Patient does not have family history of breast cancer, known genetic mutations, or radiation treatment to the chest before age 48. Patient does not have history of cervical dysplasia, immunocompromised, or DES exposure in-utero.  Risk Assessment    Risk Scores      01/30/2020   Last edited by: Meryl Dare, CMA   5-year risk: 0.6 %   Lifetime risk: 6.1 %         A: BCCCP exam without pap smear No complaints.  P: Referred patient to the [facility] for a screening mammogram on the mobile unit. Appointment scheduled Thursday, March 12, 2020 at 1000.  Haley Heidelberg, RN 01/30/2020 10:04 AM

## 2020-02-17 ENCOUNTER — Other Ambulatory Visit: Payer: Self-pay

## 2020-02-17 ENCOUNTER — Other Ambulatory Visit: Payer: No Typology Code available for payment source

## 2020-02-17 ENCOUNTER — Inpatient Hospital Stay: Payer: Self-pay | Attending: Obstetrics and Gynecology | Admitting: *Deleted

## 2020-02-17 VITALS — BP 114/82 | Ht 63.0 in | Wt 170.7 lb

## 2020-02-17 DIAGNOSIS — R300 Dysuria: Secondary | ICD-10-CM

## 2020-02-17 DIAGNOSIS — Z0000 Encounter for general adult medical examination without abnormal findings: Secondary | ICD-10-CM

## 2020-02-17 LAB — POCT URINALYSIS DIPSTICK
Bilirubin, UA: NEGATIVE
Glucose, UA: NEGATIVE
Ketones, UA: NEGATIVE
Leukocytes, UA: NEGATIVE
Nitrite, UA: NEGATIVE
Protein, UA: POSITIVE — AB
Spec Grav, UA: >=1.03 — AB (ref 1.010–1.025)
Urobilinogen, UA: 0.2 E.U./dL
pH, UA: 6 (ref 5.0–8.0)

## 2020-02-17 MED ORDER — PHENAZOPYRIDINE HCL 200 MG PO TABS: ORAL_TABLET | ORAL | 0 refills | Status: DC

## 2020-02-17 MED ORDER — NITROFURANTOIN MONOHYD MACRO 100 MG PO CAPS: 100.0000 mg | ORAL_CAPSULE | Freq: Two times a day (BID) | ORAL | 0 refills | Status: AC

## 2020-02-17 NOTE — Progress Notes (Signed)
[facility] initial screening   Interpreter- Natale Lay, [employer]   Clinical Measurement: There were no vitals filed for this visit. Fasting Labs Drawn Today, will review with patient when they result.   Medical History:  Patient states that she does not have high cholesterol, does not have high blood pressure and she does not have diabetes.  Medications:  Patient states that she does not take medication to lower cholesterol, blood pressure and blood sugar.  Patient does not take an aspirin a day to help prevent a heart attack or stroke.    Blood pressure, self measurement: Patient states that she does not measure blood pressure from home. She checks her blood pressure N/A. She shares her readings with a health care provider: N/A.   Nutrition: Patient states that on average she eats 1 cups of fruit and 1 cups of vegetables per day. Patient states that she does eat fish at least 2 times per week. Patient eats less than half servings of whole grains. Patient drinks less than 36 ounces of beverages with added sugar weekly: yes. Patient is currently watching sodium or salt intake: no. In the past 7 days patient has had 0 drinks containing alcohol. On average patient drinks 0 drinks containing alcohol per day.      Physical activity:  Patient states that she gets 45 minutes of moderate and 0 minutes of vigorous physical activity each week.  Smoking status:  Patient states that she has has never smoked .   Quality of life:  Over the past 2 weeks patient states that she had little interest or pleasure in doing things: several days. She has been feeling down, depressed or hopeless:not at all.    Risk reduction and counseling:   Health Coaching: Discussed with patient the daily recommendation for fruit and vegetables. Explained that the goal should be for 2 fruits and 3 vegetables per day. Patient currently eats fish twice a week. Gave recommendations for the different types of fish that are highest in  Omega-3's (salmon, tuna, mackerel or sardines). Also gave patient different options for whole grains that can be added to daily diet (oatmeal, brown rice, whole wheat bread and pasta and whole grain cereals). Patient currently consumes about 2 cups of agua de fruita a day. Encouraged patient to try and stop adding sugar to it and use only fruit for flavoring/sweetness.   Navigation:  I will notify patient of lab results.  Patient is aware of 2 more health coaching sessions and a follow up.  Time: 20 minutes

## 2020-02-18 LAB — LIPID PANEL
Chol/HDL Ratio: 7.9 ratio — ABNORMAL HIGH (ref 0.0–4.4)
Cholesterol, Total: 174 mg/dL (ref 100–199)
HDL: 22 mg/dL — ABNORMAL LOW (ref >39)
LDL Chol Calc (NIH): 70 mg/dL (ref 0–99)
Triglycerides: 527 mg/dL — ABNORMAL HIGH (ref 0–149)
VLDL Cholesterol Cal: 82 mg/dL — ABNORMAL HIGH (ref 5–40)

## 2020-02-18 LAB — HEMOGLOBIN A1C
Est. average glucose Bld gHb Est-mCnc: 108 mg/dL
Hgb A1c MFr Bld: 5.4 % (ref 4.8–5.6)

## 2020-02-18 LAB — GLUCOSE, RANDOM: Glucose: 86 mg/dL (ref 65–99)

## 2020-02-19 LAB — URINE CULTURE

## 2020-02-20 ENCOUNTER — Telehealth: Payer: Self-pay

## 2020-02-20 NOTE — Telephone Encounter (Signed)
Health coaching 2   interpreter- Natale Lay, [employer]   Labs-174 cholesterol, 70 LDL cholesterol, 527 triglycerides, 22 HDL cholesterol, 5.4 hemoglobin A1C, 86 mean plasma glucose. Patient understands and is aware of her lab results.   Goals-  Discussed lab results in detail with patient and answered any questions that the patient had regarding lab results.  1. Decrease the amount of fried and fatty foods consumed. 2. Avoid sugar and refined carbs. 3. Increase exercise to at least 20 minutes per day.  4. Increase the amount of healthy fats in diet. 5. Increase whole grains and heart healthy fish. 6. Increase foods rich in fiber.   Navigation:  Patient is aware of 1 more health coaching sessions and a follow up. Patient is scheduled for follow-up appointment with Internal Medicine on Monday, February 24, 2020 @ 8:45 am. Stressed the importance of following up for elevated labs.  Time- 16 minutes

## 2020-02-24 ENCOUNTER — Encounter: Payer: Self-pay | Admitting: Student

## 2020-02-24 ENCOUNTER — Ambulatory Visit (INDEPENDENT_AMBULATORY_CARE_PROVIDER_SITE_OTHER): Payer: Self-pay | Admitting: Student

## 2020-02-24 ENCOUNTER — Other Ambulatory Visit: Payer: Self-pay

## 2020-02-24 DIAGNOSIS — E781 Pure hyperglyceridemia: Secondary | ICD-10-CM

## 2020-02-24 NOTE — Assessment & Plan Note (Signed)
Patient presents to clinic as a new patient for evaluation of hypertriglyceridemia detected on screening Lipid Profile.  Lipid Panel     Component Value Date/Time   CHOL 174 02/17/2020 0957   TRIG 527 (H) 02/17/2020 0957   HDL 22 (L) 02/17/2020 0957   CHOLHDL 7.9 (H) 02/17/2020 0957   CHOLHDL 4.2 Ratio 05/10/2010 2128   VLDL 43 (H) 05/10/2010 2128   LDLCALC 70 02/17/2020 0957   LABVLDL 82 (H) 02/17/2020 0957   She states that she cooks all of her meals at home, however she is unsure of healthier options. She exercises by walking for 15-20 minutes approximately twice weekly, but is interested in increasing the length and frequency of her exercise. She states that her mother may have had high cholesterol, but she does not know of a family history of high triglycerides. She denies alcohol of tobacco use.  Assessment/Plan Patient presenting with moderate isolated hypertriglyceridemia detected on lipid profile which may have been non-fasting. She does not require initation of a lipid-lowering medication at this time especially given her appropriate LDL, total cholesterol, and ASCVD score. However, it is important to monitor this condition for progression with repeat labs. -Triglyceride lowering diet provided -No medication at this time -Encouraged to increase exercise length to and to try for three times per week -Consider checking lipid profile in 3-6 months

## 2020-02-24 NOTE — Patient Instructions (Signed)
Estimada Sra. Haley Norton,  Fue un placer conocerte hoy en la clnica. Para su hipertrigliceridemia, no es necesario comenzar a Set designer en este momento; sin embargo, nos gustara volver a Industrial/product designer en varios meses para asegurarnos de que el nivel no haya aumentado. Las dos cosas ms importantes que puede hacer mientras tanto para reducir sus triglicridos incluiran aumentar la duracin, la intensidad y la frecuencia de su ejercicio, as Environmental consultant su dieta.  De lo contrario, me alegro de que est bien y espero verlo en su prxima cita.  Atentamente, Dr. Despina Hidden, MD   Plan de alimentacin para los niveles altos de triglicridos High Triglycerides Eating Plan Los triglicridos son un tipo de grasas que se Hotel manager. Los niveles elevados de triglicridos pueden aumentar el riesgo de Marine scientist enfermedades cardacas y accidente cerebrovascular. Si sus niveles de triglicridos son TEFL teacher, Public relations account executive los alimentos adecuados lo ayudar a Microbiologist triglicridos y a Photographer salud del Programmer, multimedia. Trabaje junto con el mdico o con un especialista en alimentacin y nutricin (nutricionista) a fin de crear y seguir un plan de alimentacin que sea adecuado para usted. Consejos para seguir [employer] plan [place]autas generales   Baje de peso, si tiene sobrepeso. En la [employer] Publishing copy de 5a 10libras (2a 5kg) ayuda a reducir [employer] de triglicridos. Un plan para bajar de peso puede incluir lo siguiente. ? Treinta minutos de actividad fsica al menos 5das a la semana. ? Reducir la cantidad de caloras, azcar y grasas que consume.  Coma una gran variedad de frutas frescas, verduras y cereales integrales. Estos alimentos son ricos en fibra.  Coma alimentos que contengan grasas saludables, como pescados grasos, frutos secos, semillas y aceite de [place].  Evite los alimentos con alto contenido de International aid/development worker agregada, sal (sodio) agregada,  grasas saturadas y grasas trans.  Evite los carbohidratos refinados, con bajo contenido de [place], como el pan [place], las [place], los fideos y el arroz blanco.  Evite los alimentos con aceites parcialmente hidrogenados (grasas trans), como alimentos fritos o margarina en barra.  Limite el consumo de alcohol a no ms de por da si es mujer y no est [place], y por da si es hombre. Una medida equivale a 12oz ( ) de cerveza, 5oz ( ) de vino o 1oz (28ml) de bebidas alcohlicas de alta graduacin. Segn su estado de salud general, el mdico puede recomendarle que consuma menos alcohol. Leer las etiquetas de los alimentos  Fjese la cantidad de grasas saturadas en las etiquetas de los alimentos. Elija alimentos sin grasas saturadas o con muy poca cantidad de estas grasas.  Fjese la cantidad de grasas trans en las etiquetas de los alimentos. Elija alimentos sin grasas trans.  Fjese la cantidad de colesterol en las etiquetas de los alimentos. Elija alimentos con bajo contenido de colesterol. Pregntele al nutricionista la cantidad de colesterol que debe consumir a diario.  Fjese la cantidad de sodio en las etiquetas de los alimentos. Elija alimentos con menos de (mg) por porcin. De compras  Compre productos lcteos cuya etiqueta diga que no tienen grasa (descremados) o que tienen bajo contenido de grasa (1%).  Evite comprar alimentos procesados o preenvasados. En general, estos tienen un alto contenido de azcar agregada, sodio y grasas. Coccin  Cuando cocine, elija grasas saludables, como aceite de [place] o aceite de canola.  Use mtodos de coccin con menor contenido de grasas, tales como hornear, Transport planner, Physiological scientist.  Cuando  sea posible, prepare sus propias salsas, aderezos y [place], Teacher, English as a foreign language de comprarlos. Las salsas, los aderezos y las marinadas comprados en las tiendas suelen tener alto contenido de sodio y  International aid/development worker. Planificacin de las comidas  Consuma ms comida casera y menos de restaurante, de bufs y comida rpida.  Coma pescados grasos al [employer] veces por semana. [address] ejemplos de pescados grasos, se incluyen salmn, [place], [place], atn y arenque.  Si come los [employer], no coma ms de tres yemas por semana. Qu alimentos se recomiendan? Esta podra no ser [employer]. Hable con el nutricionista sobre las mejores opciones alimenticias para usted. Cereales Panes, galletas, cereales y pastas integrales. Avena sin azcar. Trigo burgol. Cebada. Quinua. Arroz integral. Tortillas de harina integral. Verduras Verduras frescas o congeladas. Verduras enlatadas con bajo contenido de [place]. Nils Pyle Nils Pyle frescas, en conserva (en su jugo natural) o frutas congeladas. Carnes y otros alimentos ricos en protenas Pollo o pavo sin piel. Carne de pollo o de [place]. Cortes magros de [place], sin Holiday representative. Pescados y mariscos, especialmente salmn, [place] y arenque. Claras de huevo. Porotos, guisantes o lentejas secos. Frutos secos o semillas sin sal. Frijoles enlatados sin sal. Mantequilla natural de man o almendra. Lcteos Productos lcteos descremados. Leche descremada o semidescremada (al 1%). Quesos con contenido reducido de grasa (2%) y bajos en sodio. Ricota con bajo contenido de [place]. Queso cottage con bajo contenido de [place]. Yogur natural con bajo contenido de [place]. Grasas y aceites Margarina untable que no contenga grasas trans. Mayonesa light o con contenido reducido de [place] Aderezos para ensaladas light o con contenido reducido de [place]. Aceites de canola, crtamo, oliva, soja y [place]. Qu alimentos no se recomiendan? Esta podra no ser [employer]. Hable con el nutricionista sobre las mejores opciones alimenticias para usted. Cereales Pan blanco. Pastas blancas (comunes). Arroz blanco. Pan de maz. Bagels. Pasteles. Galletas saladas que contengan  grasas trans. Verduras Verduras con crema o fritas. Verduras en salsa de [place]. Nils Pyle Frutas pasas endulzadas. Frutas enlatadas con almbar. Jugo de frutas. Carnes y otros alimentos ricos en protenas Cortes de carne con grasa. Costillas. Alas de pollo. [place]oc[place]. Salchichas. Mortadela. Salame. Chinchulines. Panceta. Perros calientes. Salchicha de cerdo. Fiambres envasados. Lcteos Leche entera o con contenido reducido de grasa (2%). Mitad leche y English as a second language teacher. Queso crema. Yogur entero o endulzado. Quesos con toda su grasa. Sustitutos de cremas no lcteas. Coberturas batidas. Quesos para untar o quesos procesados. Requesn. Bebidas Alcohol. Bebidas endulzadas, como refrescos, limonadas, bebidas frutales o ponches. Grasas y [employer]. Margarina en barra. Manteca de cerdo. Lardo. Mantequilla clarificada. Grasa de panceta. Aceites tropicales, como aceite de coco, de palmiste o de palma. Dulces y postres Jarabe de maz. Azcares. Miel. Melaza. Caramelos. Mermelada y [place]. Doreen Beam. Cereales endulzados. Galletitas. Tartas. Bizcochuelos. Rosquillas. Muffins. Helados. Condimentos Salsas, aderezos y [place] con alto contenido de International aid/development worker, [employer], como salsa barbacoa y [place]. Resumen  Un nivel elevado de triglicridos puede aumentar el riesgo de padecer enfermedades cardacas y accidente cerebrovascular. Elegir los alimentos adecuados puede ayudar a Stage manager de triglicridos.  Coma gran cantidad de frutas frescas, verduras y cereales integrales. Elija productos lcteos semidescremados y carnes magras. Coma pescados grasos al [employer] veces por semana.  Evite los alimentos procesados y preenvasados con azcar agregado, sodio, grasas saturadas y grasas trans.  Si tiene sugerencias o preguntas sobre los tipos de alimentos que son convenientes para usted, hable con su mdico o con un nutricionista. Esta  informacin no tiene Theme park manager el consejo del mdico.  Asegrese de hacerle al mdico cualquier pregunta que tenga. Document Revised: 01/09/2017 Document Reviewed: 01/09/2017 Elsevier Patient Education  2020 [employer].

## 2020-02-24 NOTE — Progress Notes (Signed)
Internal Medicine Clinic Attending  I saw and evaluated the patient.  I personally confirmed the key portions of the history and exam documented by Dr. Johnson and I reviewed pertinent patient test results.  The assessment, diagnosis, and plan were formulated together and I agree with the documentation in the resident's note.  

## 2020-02-24 NOTE — Progress Notes (Signed)
   CC: Hypertriglyceridemia  HPI:  Ms.Dmiyah Bittinger is a 49 y.o. with past medical history of rheumatoid arthritis (on Humira), iron deficiency anemia, and GERD who presents to clinic for evaluation of hypertriglyceridemia. Refer to problem list for Assessment/Plan based charting of this encounter.  Past Medical History:  Diagnosis Date  . Chronic kidney disease    Dr. Hyman Hopes  . GERD (gastroesophageal reflux disease)   . Rheumatic joint disease 1997   Renal involvement as well.  Followed by [facility] Rheumatology, Dr. Hyman Hopes, Nephrology, and [facility]   Family History  Problem Relation Age of Onset  . Diabetes Father   . Stroke Mother   . Heart disease Mother    Social History   Tobacco Use  . Smoking status: Never Smoker  . Smokeless tobacco: Never Used  Vaping Use  . Vaping Use: Never used  Substance Use Topics  . Alcohol use: No  . Drug use: No   Review of Systems: Denies nausea, vomiting, diarrhea, chest pain, shortness of breath, headaches, blurry vision, lightheadedness, dizziness.  Physical Exam:  Vitals:   02/24/20 0857  BP: 129/68  Pulse: 77  Temp: 98.4 F (36.9 C)  TempSrc: Oral  Weight: 174 lb 12.8 oz (79.3 kg)  Physical Exam Vitals reviewed.  Constitutional:      Appearance: Normal appearance.  HENT:     Head: Normocephalic and atraumatic.  Eyes:     Extraocular Movements: Extraocular movements intact.     Conjunctiva/sclera: Conjunctivae normal.  Cardiovascular:     Rate and Rhythm: Normal rate and regular rhythm.     Pulses: Normal pulses.     Heart sounds: Normal heart sounds.  Pulmonary:     Effort: Pulmonary effort is normal.     Breath sounds: Normal breath sounds.  Abdominal:     General: Abdomen is flat. Bowel sounds are normal.     Palpations: Abdomen is soft.     Tenderness: There is no abdominal tenderness.  Musculoskeletal:        General: Normal range of motion.     Cervical back: Normal range of motion and neck  supple.  Skin:    General: Skin is warm and dry.     Capillary Refill: Capillary refill takes less than 2 seconds.  Neurological:     General: No focal deficit present.     Mental Status: She is alert. Mental status is at baseline.  Psychiatric:        Mood and Affect: Mood normal.        Behavior: Behavior normal.        Thought Content: Thought content normal.        Judgment: Judgment normal.    Assessment & Plan:   See Encounters Tab for problem based charting.  Patient seen with Dr. Mayford Knife

## 2020-03-12 ENCOUNTER — Other Ambulatory Visit: Payer: Self-pay

## 2020-03-12 ENCOUNTER — Ambulatory Visit
Admission: RE | Admit: 2020-03-12 | Discharge: 2020-03-12 | Disposition: A | Discharge status: Home / Self Care | Payer: No Typology Code available for payment source | Source: Ambulatory Visit | Attending: Obstetrics and Gynecology | Admitting: Obstetrics and Gynecology

## 2020-04-28 ENCOUNTER — Other Ambulatory Visit: Payer: Self-pay

## 2020-04-28 DIAGNOSIS — Z79899 Other long term (current) drug therapy: Secondary | ICD-10-CM

## 2020-04-28 NOTE — Progress Notes (Unsigned)
Not clear what is going on with [name].   She has not been seen here for a visit since 05/2018 and appears recently established with [facility] IM residency program outpatient clinic. Please call her and share with her she needs to choose one primary care provider and stick with them--she should be getting labs with that provider. We are happy to continue to see her, but we need to actually have her seen here at least once if not twice yearly and not just for her labs.

## 2020-04-29 LAB — COMPREHENSIVE METABOLIC PANEL
ALT: 16 IU/L (ref 0–32)
AST: 18 IU/L (ref 0–40)
Albumin/Globulin Ratio: 1.3 (ref 1.2–2.2)
Albumin: 4.3 g/dL (ref 3.8–4.8)
Alkaline Phosphatase: 68 IU/L (ref 44–121)
BUN/Creatinine Ratio: 19 (ref 9–23)
BUN: 15 mg/dL (ref 6–24)
Bilirubin Total: 0.6 mg/dL (ref 0.0–1.2)
CO2: 24 mmol/L (ref 20–29)
Calcium: 9.3 mg/dL (ref 8.7–10.2)
Chloride: 104 mmol/L (ref 96–106)
Creatinine, Ser: 0.8 mg/dL (ref 0.57–1.00)
GFR calc Af Amer: 101 mL/min/{1.73_m2} (ref >59)
GFR calc non Af Amer: 87 mL/min/{1.73_m2} (ref >59)
Globulin, Total: 3.3 g/dL (ref 1.5–4.5)
Glucose: 83 mg/dL (ref 65–99)
Potassium: 4.5 mmol/L (ref 3.5–5.2)
Sodium: 142 mmol/L (ref 134–144)
Total Protein: 7.6 g/dL (ref 6.0–8.5)

## 2020-04-29 LAB — CBC WITH DIFFERENTIAL/PLATELET
Basophils Absolute: 0 10*3/uL (ref 0.0–0.2)
Basos: 0 %
EOS (ABSOLUTE): 0.4 10*3/uL (ref 0.0–0.4)
Eos: 9 %
Hematocrit: 40.1 % (ref 34.0–46.6)
Hemoglobin: 13.1 g/dL (ref 11.1–15.9)
Immature Grans (Abs): 0 10*3/uL (ref 0.0–0.1)
Immature Granulocytes: 0 %
Lymphocytes Absolute: 1.6 10*3/uL (ref 0.7–3.1)
Lymphs: 34 %
MCH: 28.4 pg (ref 26.6–33.0)
MCHC: 32.7 g/dL (ref 31.5–35.7)
MCV: 87 fL (ref 79–97)
Monocytes Absolute: 0.6 10*3/uL (ref 0.1–0.9)
Monocytes: 12 %
Neutrophils Absolute: 2.1 10*3/uL (ref 1.4–7.0)
Neutrophils: 45 %
Platelets: 301 10*3/uL (ref 150–450)
RBC: 4.61 x10E6/uL (ref 3.77–5.28)
RDW: 13.4 % (ref 11.7–15.4)
WBC: 4.7 10*3/uL (ref 3.4–10.8)

## 2020-05-04 ENCOUNTER — Other Ambulatory Visit: Payer: Self-pay | Admitting: *Deleted

## 2020-05-04 ENCOUNTER — Other Ambulatory Visit: Payer: Self-pay

## 2020-05-04 DIAGNOSIS — Z124 Encounter for screening for malignant neoplasm of cervix: Secondary | ICD-10-CM

## 2020-05-04 NOTE — Progress Notes (Signed)
Patient: Haley Norton           Date of Birth: 1970-10-01           MRN: [id number] Visit Date: 05/04/2020 PCP: Roylene Reason, MD  Cervical Cancer Screening Do you smoke?: No Have you ever had or been told you have an allergy to latex products?: No Marital status: Married Date of last pap smear: 2-5 yrs ago (05/12/2017-Negative ([place]) ) Date of last menstrual period:  (Postmenopausal) Number of pregnancies: 3 Number of births: 2 Have you ever had any of the following? Hysterectomy: No Tubal ligation (tubes tied): Yes Abnormal bleeding: No Abnormal pap smear: No Venereal warts: No A sex partner with venereal warts: No A high risk* sex partner: No  Cervical Exam  Abnormal Observations: Normal Exam. Recommendations: Last Pap smear was 05/12/2017 at [facility] clinic and was normal. Per patient has no history of an abnormal Pap smear. Let patient know that if today's Pap smear is normal and HPV negative that her next Pap smear is due in 5 years.    Used Spanish interpreter [employer] from [place].  Patient's History Patient Active Problem List   Diagnosis Date Noted  . Hypertriglyceridemia without hypercholesterolemia 02/24/2020  . Dyslipidemia 05/12/2017  . Rheumatic joint disease (HCC)   . DYSURIA 01/27/2010  . HYPERGLYCEMIA 01/12/2010  . ABDOMINAL PAIN, GENERALIZED 08/04/2009  . VAGINITIS, CANDIDAL 05/15/2009  . MICROSCOPIC HEMATURIA 06/24/2008  . Abdominal pain, other specified site 05/27/2008  . PAIN IN JOINT PELVIC REGION AND THIGH 04/10/2008  . LOW BACK PAIN, CHRONIC 04/10/2008  . ACUTE CYSTITIS 02/06/2008  . ALLERGIC RHINITIS, SEASONAL 09/26/2007  . DYSLIPIDEMIA 08/10/2007  . CORNS AND CALLUSES 08/10/2007  . HEADACHE 08/10/2007  . DENTAL PAIN 05/22/2007  . NASOPHARYNGITIS 04/19/2007  . ARTHRITIS, RHEUMATOID 01/06/2007   Past Medical History:  Diagnosis Date  . Chronic kidney disease    Dr. Hyman Hopes  . GERD (gastroesophageal reflux  disease)   . Rheumatic joint disease (HCC) 1997   Renal involvement as well.  Followed by [facility] Rheumatology, Dr. Hyman Hopes, Nephrology, and [facility]    Family History  Problem Relation Age of Onset  . Diabetes Father   . Stroke Mother   . Heart disease Mother     Social History   Occupational History  . Occupation: Housewife  Tobacco Use  . Smoking status: Never Smoker  . Smokeless tobacco: Never Used  Vaping Use  . Vaping Use: Never used  Substance and Sexual Activity  . Alcohol use: No  . Drug use: No  . Sexual activity: Yes    Birth control/protection: Surgical

## 2020-05-06 LAB — CYTOLOGY - PAP
Comment: NEGATIVE
Diagnosis: UNDETERMINED — AB
High risk HPV: NEGATIVE

## 2020-05-07 ENCOUNTER — Telehealth: Payer: Self-pay

## 2020-05-07 NOTE — Telephone Encounter (Addendum)
Via, Natale Lay, Spanish Interpreter, Patient informed pap results, ASC-US/HPV-negative, needs to repeat pap in 1 year. Patient verbalized understanding, stated she will call to schedule pap smear for 04/2021.   ----- Message from Catalina Antigua, MD sent at 05/06/2020 10:13 AM EST ----- Repeat pap smear in 1 year

## 2020-05-18 ENCOUNTER — Encounter: Payer: No Typology Code available for payment source | Admitting: Internal Medicine

## 2020-05-25 ENCOUNTER — Encounter: Payer: Self-pay | Admitting: Student

## 2020-05-25 ENCOUNTER — Ambulatory Visit: Payer: Self-pay | Admitting: Student

## 2020-05-25 VITALS — BP 143/74 | HR 82 | Temp 98.4°F | Wt 170.0 lb

## 2020-05-25 DIAGNOSIS — E785 Hyperlipidemia, unspecified: Secondary | ICD-10-CM

## 2020-05-25 DIAGNOSIS — F430 Acute stress reaction: Secondary | ICD-10-CM

## 2020-05-25 DIAGNOSIS — E781 Pure hyperglyceridemia: Secondary | ICD-10-CM

## 2020-05-25 DIAGNOSIS — F411 Generalized anxiety disorder: Secondary | ICD-10-CM

## 2020-05-25 NOTE — Assessment & Plan Note (Addendum)
Assessment Patient seen 02/24/20 for evaluation of hypertriglyceridemia (HTG). At that time her Trig levels were 527, with total chole of 174 and LDL-C of 70. Patient falls into criteria of moderate-severe HTG It was uncertain at that time if the patient was fasting or not and therefore, lifestyle modifications of altering her diet and increase her exercise were discussed. She does not have a smoking history or a diagnosis of diabetes. Her ASCVD 10 year risk is 3.7% (low). Since her prior visit she has lost 4 lbs in 3 months. Congratulated her on this accomplishment and encouraged her to keep up the good work.  She endorses working on her diet, eating more fruits and vegetables. She exercises 4 times a week.   We will repeat her lipid panel and see how she is progressing. I do not see an indication to start statin therapy as her LDL is within normal limits. The patient would like to focus on lifestyle changes at this time, however, she did endorse periodic LUQ and epigastric abdominal pain. There is a possibility that this is pancreatic pain from her elevated triglyceride levels and if so, would more than likely need to be treated with medical therapy.    Patient given instructions if abdominal pain persists to call clinic or if unbearable go to closest ED  Plan - check lipid panel today - continue lifestyle modifications; exercise and improved diet - if tg elevated, will discuss with patient starting fenofibrate therapy in setting of possible upper abdominal pain - if tg lowering, continue lifestyle modification

## 2020-05-25 NOTE — Patient Instructions (Addendum)
  Gracias por permitirnos cuidar de usted hoy! Hoy discutimos  1) Niveles altos de triglicridos: los repetiremos hoy. Lo llamaremos para informarle los resultados y Arts administrator en el futuro. Si tiene algn dolor abdominal persistente, llame a nuestra oficina  2) Frecuencia cardaca rpida: creo que esto se debe a los factores estresantes en su vida y me alegra saber que tiene un buen sistema de apoyo. Si siente que necesita ms apoyo, no dude en llamar. Si siente que tiene estos episodios cuando est descansando o no est estresado, llame a nuestra oficina. Si tiene Radiographer, therapeutic en el pecho con estos episodios, llame al 911.  Gracias y si tiene [place] pregunta, no dude en llamar a nuestra oficina.  Dr. Thalia Bloodgood D.O.

## 2020-05-25 NOTE — Progress Notes (Addendum)
CC: Hypertriglyceridemia   HPI:  [name] is a 49 y.o. female with a past medical history stated below and presents today for hypertriglyceridemia. Please see problem based assessment and plan for additional details.  Past Medical History:  Diagnosis Date  . Chronic kidney disease    Dr. Hyman Hopes  . GERD (gastroesophageal reflux disease)   . Rheumatic joint disease (HCC) 1997   Renal involvement as well.  Followed by [facility] Rheumatology, Dr. Hyman Hopes, Nephrology, and [facility]    Current Outpatient Medications on File Prior to Visit  Medication Sig Dispense Refill  . adalimumab (HUMIRA PEN STARTER) 40 MG/0.8ML injection Inject 40 mg into the skin every 14 (fourteen) days. On Tuesdays (Patient not taking: Reported on 02/17/2020)    . ciprofloxacin (CIPRO) 500 MG tablet Take 1 tablet (500 mg total) by mouth 2 (two) times daily. (Patient not taking: Reported on 01/30/2020) 6 tablet 0  . ferrous sulfate 325 (65 FE) MG tablet Take 325 mg by mouth daily with breakfast.    . fexofenadine (ALLEGRA) 180 MG tablet Take 1 tablet (180 mg total) by mouth daily. (Patient not taking: Reported on 01/30/2020)    . hydroxychloroquine (PLAQUENIL) 200 MG tablet Take 200 mg by mouth. 2 pills every other day and 1 pill every other day (Patient not taking: Reported on 01/30/2020)    . montelukast (SINGULAIR) 10 MG tablet Take 1 tablet (10 mg total) by mouth daily. 30 tablet 11  . omeprazole (PRILOSEC) 40 MG capsule 1 cap by mouth on empty stomach once daily 30 capsule 11  . phenazopyridine (PYRIDIUM) 200 MG tablet 1 twice a day as needed 6 tablet 0   No current facility-administered medications on file prior to visit.    Family History  Problem Relation Age of Onset  . Diabetes Father   . Stroke Mother   . Heart disease Mother     Social History   Socioeconomic History  . Marital status: Married    Spouse name: Lynnea Ferrier  . Number of children: 2  . Years of education: 52  . Highest  education level: 12th grade  Occupational History  . Occupation: Housewife  Tobacco Use  . Smoking status: Never Smoker  . Smokeless tobacco: Never Used  Vaping Use  . Vaping Use: Never used  Substance and Sexual Activity  . Alcohol use: No  . Drug use: No  . Sexual activity: Yes    Birth control/protection: Surgical  Other Topics Concern  . Not on file  Social History Narrative   Lives at home with husband and daughter, who is total care with Joellyn Quails Syndrome   Does not have a place to really get out an walk easily in neighborhood.     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   Social Determinants of Health   Financial Resource Strain: Not on file  Food Insecurity: Not on file  Transportation Needs: No Transportation Needs  . Lack of Transportation (Medical): No  . Lack of Transportation (Non-Medical): No  Physical Activity: Not on file  Stress: Not on file  Social Connections: Not on file  Intimate Partner Violence: Not on file    Review of Systems: ROS negative except for what is noted on the assessment and plan.  Vitals:   05/25/20 0904  BP: (!) 143/74  Pulse: 82  Temp: 98.4 F (36.9 C)  TempSrc: Oral  SpO2: 99%  Weight: 170 lb (77.1 kg)     Physical Exam: Physical Exam Vitals and nursing note  reviewed.  Constitutional:      General: She is not in acute distress.    Appearance: Normal appearance. She is not ill-appearing or toxic-appearing.  HENT:     Head: Normocephalic and atraumatic.  Cardiovascular:     Rate and Rhythm: Normal rate and regular rhythm.     Pulses: Normal pulses.     Heart sounds: Normal heart sounds. No murmur heard. No friction rub. No gallop.   Pulmonary:     Effort: Pulmonary effort is normal. No respiratory distress.     Breath sounds: Normal breath sounds. No stridor. No wheezing, rhonchi or rales.  Abdominal:     General: Abdomen is flat. There is no distension.     Tenderness: There is no abdominal tenderness.   Musculoskeletal:     Right lower leg: No edema.     Left lower leg: No edema.  Skin:    General: Skin is warm and dry.  Neurological:     Mental Status: She is alert and oriented to person, place, and time. Mental status is at baseline.  Psychiatric:        Mood and Affect: Mood normal.        Behavior: Behavior normal.        Thought Content: Thought content normal.        Judgment: Judgment normal.      Assessment & Plan:   See Encounters Tab for problem based charting.  Patient seen with Dr. Girtha Rm, D.O. [facility] Internal Medicine, PGY-1 Pager: [phone], Phone: [phone] Date 05/25/2020 Time 10:40 AM

## 2020-05-25 NOTE — Assessment & Plan Note (Signed)
Assessment: Patient states she has periodic episodes of racing heart rate that have increased recently since the passing of the patient's mother. She denies these episodes occurring outside of the above stressors. Denies chest pain, lightheadedness or dizziness. Patient informed to call 911 or go to closest ED if she has racing heart rate, chest pain without stressor or if these symptoms persist.   Patient states she has a good support system in her family and denied meeting with counseling services at this time. Encouraged patient to call us if she feels as though she needs further support.  Plan: - continue to monitor for worsening stress/anxiety - follow up with counseling services as needed

## 2020-05-26 LAB — LIPID PANEL
Chol/HDL Ratio: 7 ratio — ABNORMAL HIGH (ref 0.0–4.4)
Cholesterol, Total: 181 mg/dL (ref 100–199)
HDL: 26 mg/dL — ABNORMAL LOW (ref >39)
LDL Chol Calc (NIH): 81 mg/dL (ref 0–99)
Triglycerides: 458 mg/dL — ABNORMAL HIGH (ref 0–149)
VLDL Cholesterol Cal: 74 mg/dL — ABNORMAL HIGH (ref 5–40)

## 2020-05-26 NOTE — Progress Notes (Signed)
Internal Medicine Clinic Attending  I saw and evaluated the patient.  I personally confirmed the key portions of the history and exam documented by Dr. Katsadouros and I reviewed pertinent patient test results.  The assessment, diagnosis, and plan were formulated together and I agree with the documentation in the resident's note.  

## 2020-07-07 ENCOUNTER — Telehealth: Payer: Self-pay | Admitting: Internal Medicine

## 2020-07-07 NOTE — Telephone Encounter (Signed)
Patient called requesting an appointment because she is having UTI symptoms. Patient stated that she is urinating more frequently. After urinating, she feels a burning sensation in her bladder, and pain in lower back. Patient stated to have symptoms over a week ago an took Azo tablets that initially helped relief the symptom, however, the symptoms are now getting worse. Please advise.

## 2020-07-09 NOTE — Telephone Encounter (Signed)
This is a verbal discussion. She needs to come in for UA tomorrow.

## 2020-07-10 NOTE — Telephone Encounter (Signed)
Called patient and scheduled her appointment for 07/13/2020

## 2020-07-13 ENCOUNTER — Other Ambulatory Visit: Payer: Self-pay | Admitting: Internal Medicine

## 2020-07-13 ENCOUNTER — Other Ambulatory Visit: Payer: Self-pay

## 2020-07-13 DIAGNOSIS — R300 Dysuria: Secondary | ICD-10-CM

## 2020-07-13 LAB — POCT URINALYSIS DIPSTICK
Bilirubin, UA: NEGATIVE
Glucose, UA: NEGATIVE
Nitrite, UA: NEGATIVE
Protein, UA: NEGATIVE
Urobilinogen, UA: 0.2 E.U./dL
pH, UA: 6 (ref 5.0–8.0)

## 2020-07-13 NOTE — Addendum Note (Signed)
Addended by: Marcene Duos on: 07/13/2020 03:53 PM   Modules accepted: Orders

## 2020-07-13 NOTE — Progress Notes (Signed)
Having burning on urination and perhaps urinary frequency. She is having her period currently No flank pain or fever.     No vaginal discharge or itching--but sometimes some burning after urinating. UA shows urine to be quite concentrated. Large blood and trace (really appears more negative) leuks on dipstick UA To drink eight 8 oz glasses of water daily.

## 2020-07-15 LAB — URINE CULTURE: Organism ID, Bacteria: NO GROWTH

## 2020-08-04 ENCOUNTER — Telehealth: Payer: Self-pay | Admitting: Internal Medicine

## 2020-08-04 NOTE — Telephone Encounter (Signed)
Please call patient and discuss with her that we cannot fill her medication list as she now appears to be followed by another clinic--[facility]. She should direct her refill requests at [facility] to her physician at [facility] IM clinic. Explain to her that we are both primary care clinics. Notify [facility] that she has changed to another provider.

## 2020-08-04 NOTE — Telephone Encounter (Signed)
[facility] called requesting authorization to refill the following Rx for the patient, omeprazole (PRILOSEC) 40 MG capsule andmontelukast (SINGULAIR) 10 MG tablet. Pharmacist was concern because the prescriptions have not been filled since 08/2019.   Please note the the patient has a new PCP listed on her chart.

## 2020-08-04 NOTE — Telephone Encounter (Signed)
Patient was unaware that she was switching providers. Patient stated that she was referred by you to get a mammogram and then she was referred to the other clinic due to high Triglycerides result. Patient stated that she explained the referral provider that she has an established PCP, but was referred anyway. Patient expressed her wish to continued to be seen at [facility] and has no plans to move to another practice.

## 2020-09-22 ENCOUNTER — Other Ambulatory Visit: Payer: Self-pay | Admitting: Internal Medicine

## 2020-12-08 ENCOUNTER — Ambulatory Visit: Payer: Self-pay | Admitting: Internal Medicine

## 2020-12-09 ENCOUNTER — Telehealth: Payer: Self-pay

## 2020-12-09 NOTE — Telephone Encounter (Signed)
Called patient via Natale Lay, Haroldine Laws to conduct health coaching session 3 for [facility] program. Patient stated that she no longer wants to participate in [place] because she will receive all of her primary care needs through [employer].

## 2020-12-11 ENCOUNTER — Ambulatory Visit: Payer: Self-pay | Admitting: Internal Medicine

## 2020-12-11 ENCOUNTER — Other Ambulatory Visit: Payer: Self-pay

## 2020-12-11 ENCOUNTER — Encounter: Payer: Self-pay | Admitting: Internal Medicine

## 2020-12-11 VITALS — BP 124/82 | HR 68 | Resp 16 | Ht 63.0 in | Wt 168.0 lb

## 2020-12-11 DIAGNOSIS — Z23 Encounter for immunization: Secondary | ICD-10-CM

## 2020-12-11 DIAGNOSIS — R7309 Other abnormal glucose: Secondary | ICD-10-CM

## 2020-12-11 DIAGNOSIS — E785 Hyperlipidemia, unspecified: Secondary | ICD-10-CM

## 2020-12-11 DIAGNOSIS — M0579 Rheumatoid arthritis with rheumatoid factor of multiple sites without organ or systems involvement: Secondary | ICD-10-CM

## 2020-12-11 DIAGNOSIS — Z111 Encounter for screening for respiratory tuberculosis: Secondary | ICD-10-CM

## 2020-12-11 DIAGNOSIS — Z114 Encounter for screening for human immunodeficiency virus [HIV]: Secondary | ICD-10-CM

## 2020-12-11 DIAGNOSIS — Z1159 Encounter for screening for other viral diseases: Secondary | ICD-10-CM

## 2020-12-11 DIAGNOSIS — Z79899 Other long term (current) drug therapy: Secondary | ICD-10-CM

## 2020-12-11 DIAGNOSIS — J301 Allergic rhinitis due to pollen: Secondary | ICD-10-CM

## 2020-12-11 MED ORDER — OMEPRAZOLE 40 MG PO CPDR
DELAYED_RELEASE_CAPSULE | ORAL | 11 refills | Status: DC
Start: 2020-12-11 — End: 2021-07-06

## 2020-12-11 NOTE — Progress Notes (Signed)
Subjective:    Patient ID: Haley Norton, female   DOB: 10-Feb-1971, 50 y.o.   MRN: [id number]   HPI  Haley Norton interprets Has not been seen her for more than illness or labs for some time.  RA:  Dr. Concepcion Living is her rheumatologist:  was weaned off Plaquenil over a month time 2 years ago.    Continues on just Humira with good control.  No morning stiffness of joints.  Mild pain at times in PIPs and wrists.  States seemed to start after became sick with COVID in March of this year.  Could not get her Humira during the illness.  The discomfort is mild.  Has a little cystic lump on dorsal right ring finger PIP that started about 3 months ago.  Does not hurt currently. Last seen 08/03/20 before ill.  2.  CKD with RA:  Followed by Dr. Hyman Hopes at [facility].  Generally seen in September yearly.  She has been stable.  Reportedly she has been told her kidney disease is something from birth and not due to RA disease.  He has told her he does not expect her disease to progress.   3.  GERD:  Not taking Omeprazole and having some symptoms of reflux.  [place] card expired and so not obtaining the med at [facility]  4.  Seasonal allergies:  No problems currently--only takes Allegra and Singulair in the spring with pollen.    5.  History of ASCUS with negative HPV in December 2021.  Patient concerned needs to be repeated.  Discussed the plan is to repeat again this December 2022, instead of waiting 3 years.    6.  Vaginal dryness and tenderness of breasts.  Has only had two periods in past 2 years.  Had normal mammogram in October 2021.  Plan to repeat this October.    7.  HM:  Needs booster of Pfizer and Tdap.  Mammogram and pap up to date.  Needs CPE to go over other HM issues.  8.  Hyperlipidemia: Has not been checked since December.  Current Meds  Medication Sig   adalimumab (HUMIRA) 40 MG/0.8ML injection Inject 40 mg into the skin every 14 (fourteen) days. On Tuesdays   ferrous  sulfate 325 (65 FE) MG tablet Take 325 mg by mouth daily with breakfast.   fexofenadine (ALLEGRA) 180 MG tablet Take 1 tablet (180 mg total) by mouth daily.   montelukast (SINGULAIR) 10 MG tablet TAKE ONE TABLET BY MOUTH DAILY   omeprazole (PRILOSEC) 40 MG capsule 1 cap by mouth on empty stomach once daily   [DISCONTINUED] phenazopyridine (PYRIDIUM) 200 MG tablet 1 twice a day as needed   No Known Allergies   Review of Systems    Objective:   BP 124/82 (BP Location: Right Arm, Patient Position: Sitting, Cuff Size: Normal)   Pulse 68   Resp 16   Ht 5\' 3"  (1.6 m)   Wt 168 lb (76.2 kg)   LMP 12/30/2017   BMI 29.76 kg/m   Physical Exam NAD HEENT:  PERRL, EOMI, throat without injection Neck:  Supple, No adenopathy, no thyromegaly Chest:  CTA CV:  RRR without murmur or rub.  Radial pulses normal and equal Abd:  S, NT, No HSM or mass, + BS LE:  No edema Joints:  Firm mass on dorsal of right ring finger PIP.    Assessment & Plan    RA:  to bring the lesion on right PIP ring finger to Dr.  Thaka.  Does not feel like a cyst.  ?rheumatoid nodule.  CBC  2.  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 Hyman Hopes, renal.  CMP  3.  Allergies:  asymptomatic currently  4.  GERD:  symptoms with dry throat and mouth and choking likely related.  Restart Omeprazole,  Reflux precautions.    5.  Dyslipidmia and hyperglycemia:  FLP, A1C  6.  HM:  Vaccination for COVID, Tdap  HIV and Hep C screening.  Quantiferon/TB screen.

## 2020-12-11 NOTE — Patient Instructions (Signed)
Astroglide, Vagisil Silk, Coconut oil.

## 2020-12-12 LAB — COMPREHENSIVE METABOLIC PANEL
ALT: 16 IU/L (ref 0–32)
AST: 17 IU/L (ref 0–40)
Albumin/Globulin Ratio: 1.2 (ref 1.2–2.2)
Albumin: 4.1 g/dL (ref 3.8–4.8)
Alkaline Phosphatase: 70 IU/L (ref 44–121)
BUN/Creatinine Ratio: 20 (ref 9–23)
BUN: 15 mg/dL (ref 6–24)
Bilirubin Total: 0.3 mg/dL (ref 0.0–1.2)
CO2: 21 mmol/L (ref 20–29)
Calcium: 9.3 mg/dL (ref 8.7–10.2)
Chloride: 99 mmol/L (ref 96–106)
Creatinine, Ser: 0.75 mg/dL (ref 0.57–1.00)
Globulin, Total: 3.5 g/dL (ref 1.5–4.5)
Glucose: 96 mg/dL (ref 65–99)
Potassium: 4.3 mmol/L (ref 3.5–5.2)
Sodium: 138 mmol/L (ref 134–144)
Total Protein: 7.6 g/dL (ref 6.0–8.5)
eGFR: 98 mL/min/{1.73_m2} (ref >59)

## 2020-12-12 LAB — CBC WITH DIFFERENTIAL/PLATELET
Basophils Absolute: 0 10*3/uL (ref 0.0–0.2)
Basos: 0 %
EOS (ABSOLUTE): 0.4 10*3/uL (ref 0.0–0.4)
Eos: 8 %
Hematocrit: 39.5 % (ref 34.0–46.6)
Hemoglobin: 13.2 g/dL (ref 11.1–15.9)
Immature Grans (Abs): 0 10*3/uL (ref 0.0–0.1)
Immature Granulocytes: 0 %
Lymphocytes Absolute: 1.7 10*3/uL (ref 0.7–3.1)
Lymphs: 38 %
MCH: 28.9 pg (ref 26.6–33.0)
MCHC: 33.4 g/dL (ref 31.5–35.7)
MCV: 87 fL (ref 79–97)
Monocytes Absolute: 0.3 10*3/uL (ref 0.1–0.9)
Monocytes: 7 %
Neutrophils Absolute: 2.1 10*3/uL (ref 1.4–7.0)
Neutrophils: 47 %
Platelets: 292 10*3/uL (ref 150–450)
RBC: 4.56 x10E6/uL (ref 3.77–5.28)
RDW: 13.5 % (ref 11.7–15.4)
WBC: 4.6 10*3/uL (ref 3.4–10.8)

## 2020-12-12 LAB — LIPID PANEL W/O CHOL/HDL RATIO
Cholesterol, Total: 181 mg/dL (ref 100–199)
HDL: 23 mg/dL — ABNORMAL LOW (ref >39)
LDL Chol Calc (NIH): 66 mg/dL (ref 0–99)
Triglycerides: 597 mg/dL (ref 0–149)
VLDL Cholesterol Cal: 92 mg/dL — ABNORMAL HIGH (ref 5–40)

## 2020-12-12 LAB — HEPATITIS C ANTIBODY: Hep C Virus Ab: <0.1 s/co ratio (ref 0.0–0.9)

## 2020-12-12 LAB — HIV ANTIBODY (ROUTINE TESTING W REFLEX): HIV Screen 4th Generation wRfx: NONREACTIVE

## 2020-12-12 LAB — HGB A1C W/O EAG: Hgb A1c MFr Bld: 5.7 % — ABNORMAL HIGH (ref 4.8–5.6)

## 2021-01-21 ENCOUNTER — Other Ambulatory Visit: Payer: Self-pay

## 2021-01-21 DIAGNOSIS — Z111 Encounter for screening for respiratory tuberculosis: Secondary | ICD-10-CM

## 2021-01-26 LAB — QUANTIFERON-TB GOLD PLUS
QuantiFERON Mitogen Value: >10 IU/mL
QuantiFERON Nil Value: 0.04 IU/mL
QuantiFERON TB1 Ag Value: 0.03 IU/mL
QuantiFERON TB2 Ag Value: 0.05 IU/mL
QuantiFERON-TB Gold Plus: NEGATIVE

## 2021-01-26 LAB — SPECIMEN STATUS REPORT

## 2021-03-02 IMAGING — MG DIGITAL SCREENING BILAT W/ TOMO W/ CAD
8 series · 8 of 24 positions shown · non-contrast
Comparison: Previous exam(s).

CLINICAL DATA: Screening.

EXAM:
DIGITAL SCREENING BILATERAL MAMMOGRAM WITH TOMO AND CAD

[R CC synth-2D]
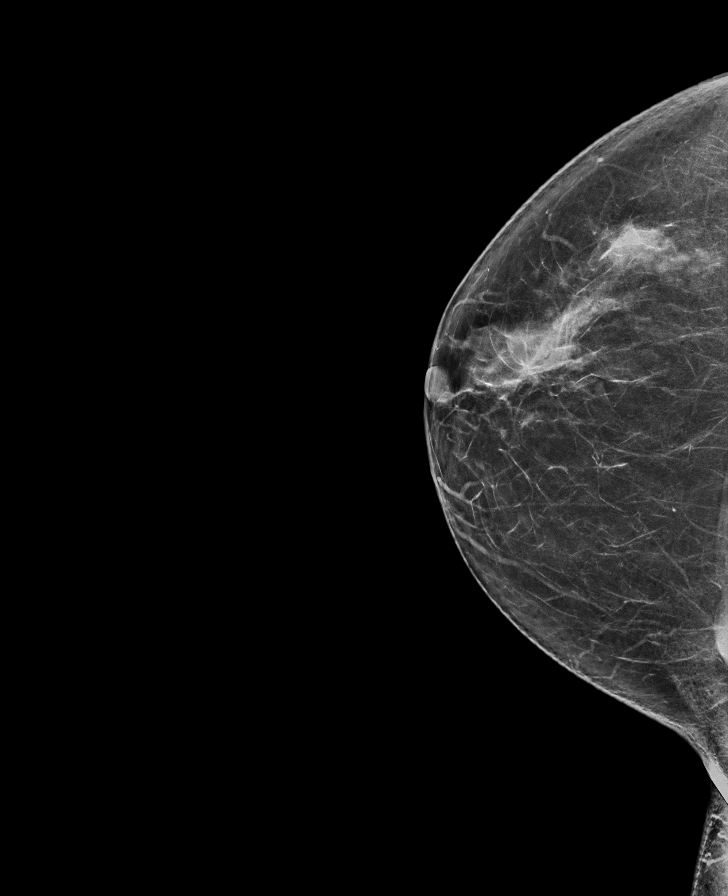

[L MLO synth-2D]
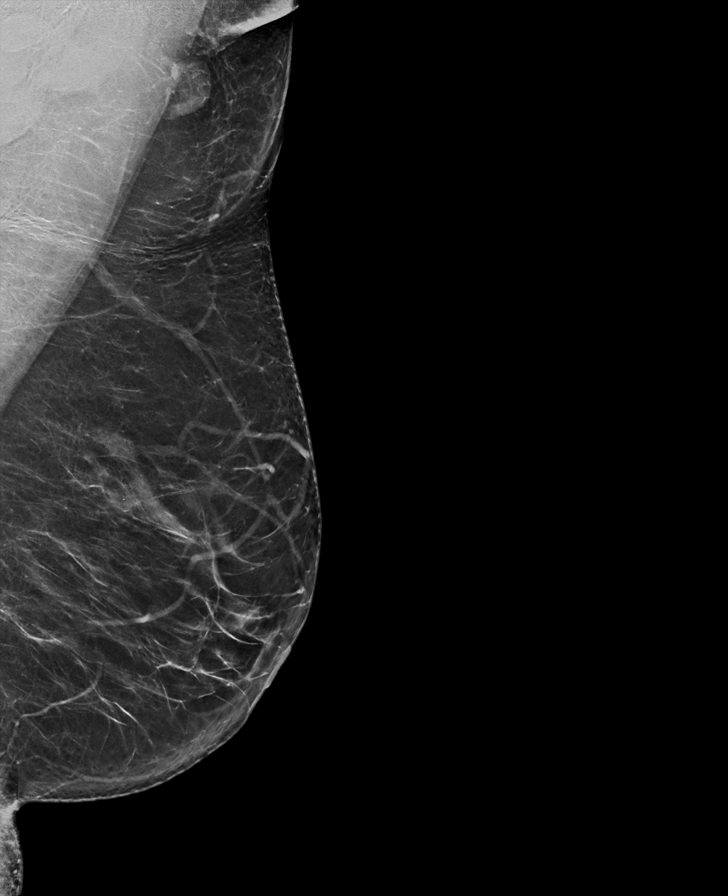

[L CC synth-2D]
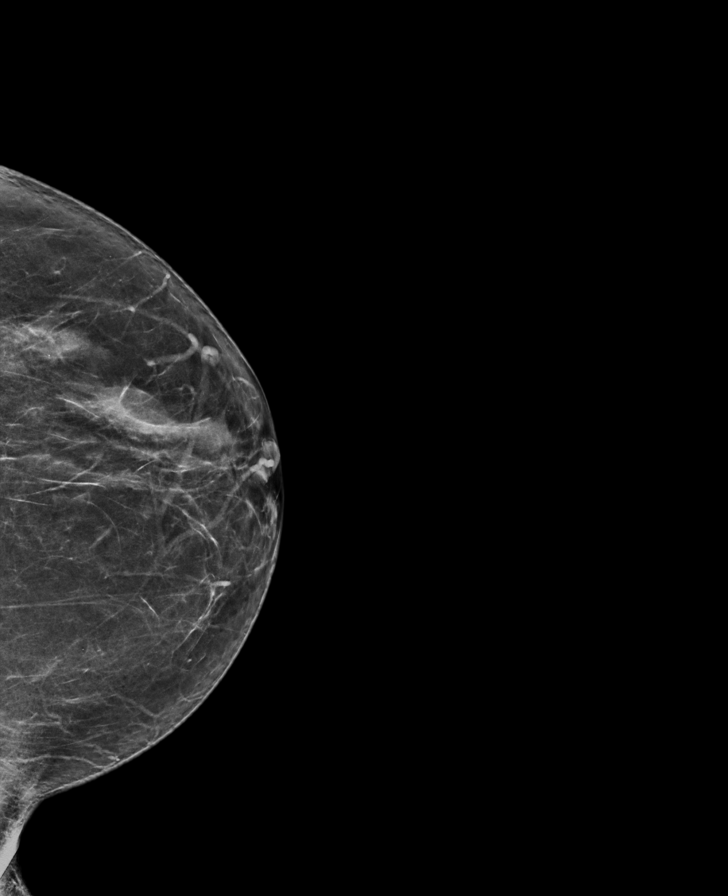

[R MLO synth-2D]
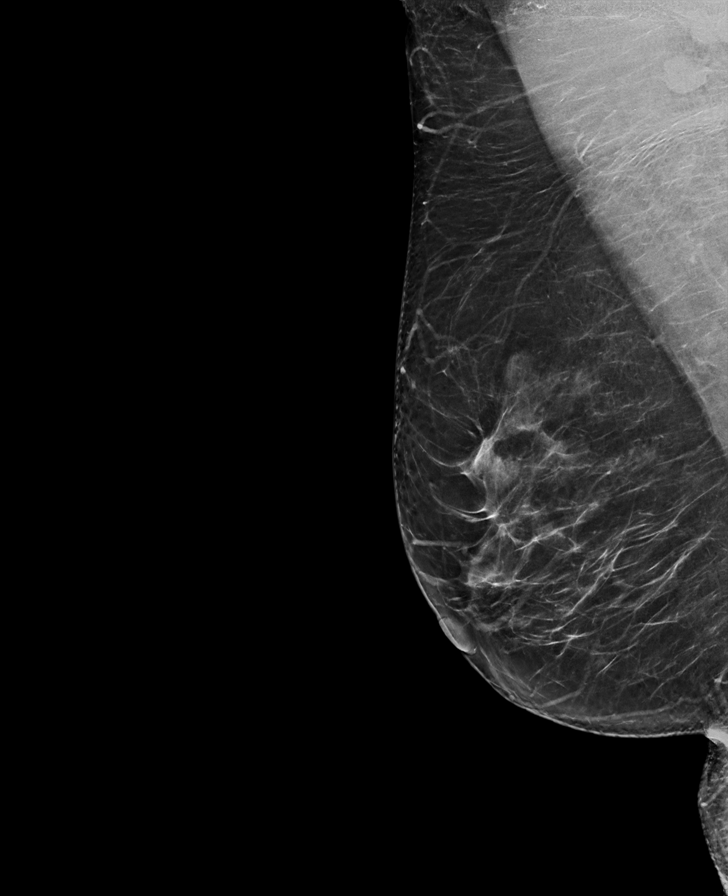

[L CC tomo · tomo slice 34/67.0]
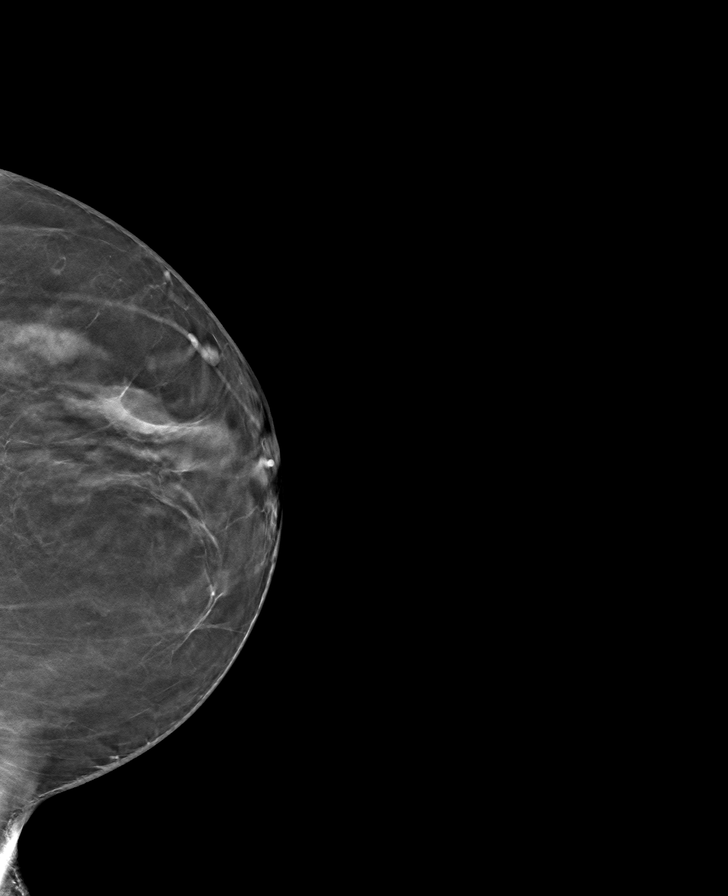

[L MLO tomo · tomo slice 36/71.0]
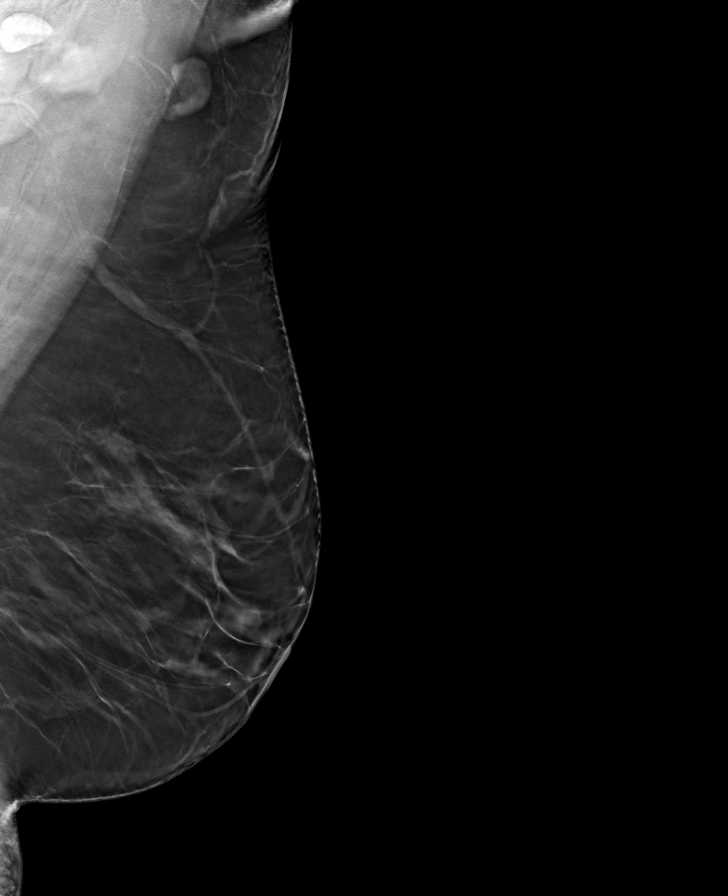

[R MLO tomo · tomo slice 38/75.0]
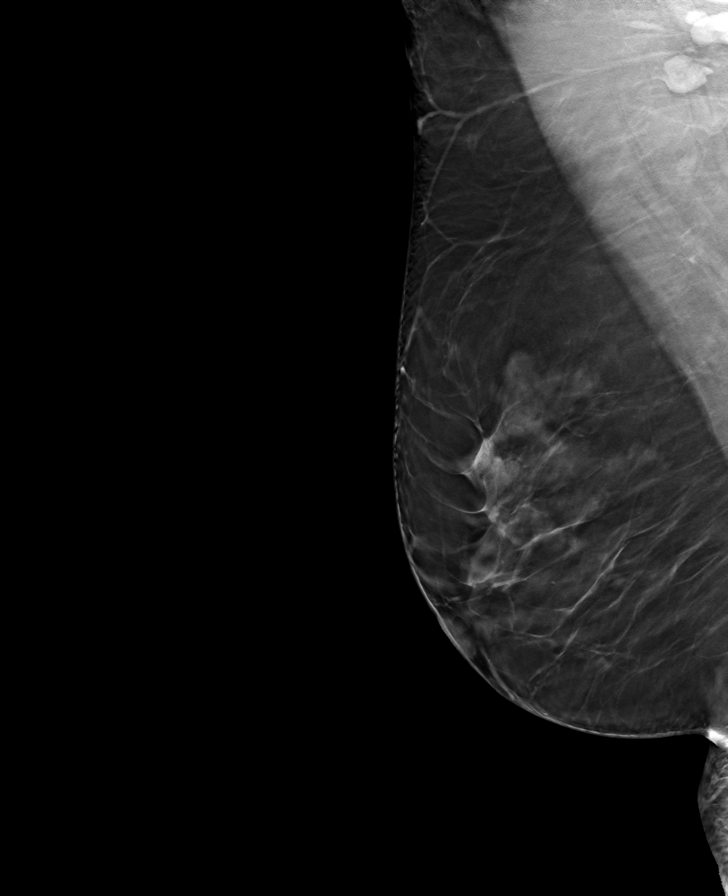

[R CC tomo · tomo slice 37/72.0]
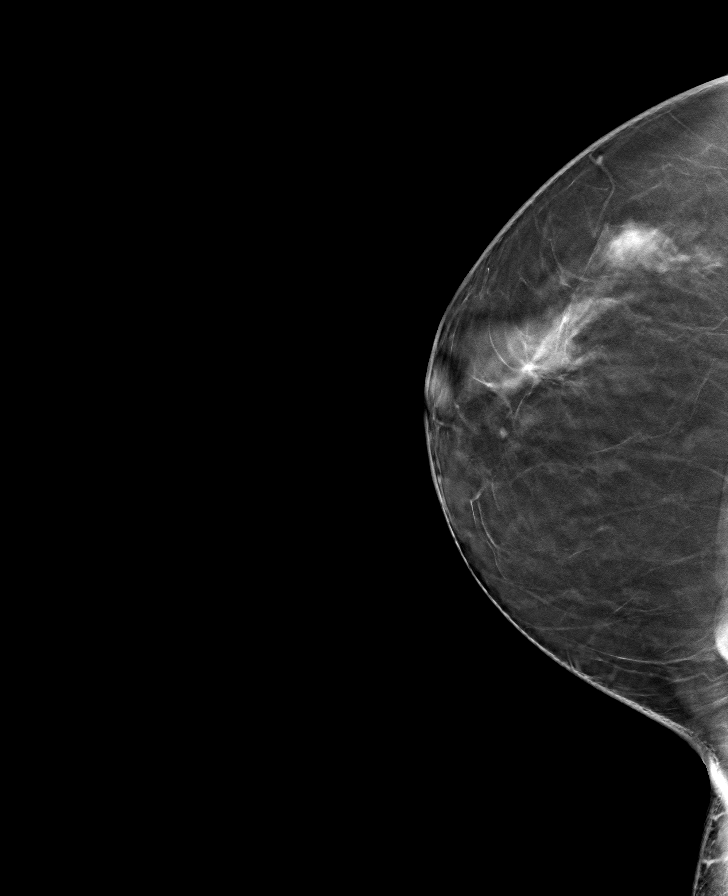

[8 of 24 positions shown; findings below may reference images not displayed]

ACR Breast Density Category b: There are scattered areas of
fibroglandular density.
FINDINGS: There are no findings suspicious for malignancy. Images were
processed with CAD.
IMPRESSION: No mammographic evidence of malignancy. A result letter of this
screening mammogram will be mailed directly to the patient.

RECOMMENDATION:
Screening mammogram in one year. (Code:[id number])

BI-RADS CATEGORY  1: Negative.

## 2021-03-23 ENCOUNTER — Other Ambulatory Visit: Payer: Self-pay | Admitting: Obstetrics and Gynecology

## 2021-03-23 DIAGNOSIS — Z1231 Encounter for screening mammogram for malignant neoplasm of breast: Secondary | ICD-10-CM

## 2021-06-07 ENCOUNTER — Other Ambulatory Visit: Payer: Self-pay

## 2021-06-07 DIAGNOSIS — E785 Hyperlipidemia, unspecified: Secondary | ICD-10-CM

## 2021-06-07 DIAGNOSIS — Z23 Encounter for immunization: Secondary | ICD-10-CM

## 2021-06-07 DIAGNOSIS — Z79899 Other long term (current) drug therapy: Secondary | ICD-10-CM

## 2021-06-07 DIAGNOSIS — R7309 Other abnormal glucose: Secondary | ICD-10-CM

## 2021-06-07 NOTE — Progress Notes (Signed)
Need f

## 2021-06-08 LAB — COMPREHENSIVE METABOLIC PANEL
ALT: 15 IU/L (ref 0–32)
AST: 19 IU/L (ref 0–40)
Albumin/Globulin Ratio: 1.4 (ref 1.2–2.2)
Albumin: 4.2 g/dL (ref 3.8–4.8)
Alkaline Phosphatase: 59 IU/L (ref 44–121)
BUN/Creatinine Ratio: 17 (ref 9–23)
BUN: 13 mg/dL (ref 6–24)
Bilirubin Total: 0.3 mg/dL (ref 0.0–1.2)
CO2: 23 mmol/L (ref 20–29)
Calcium: 9.3 mg/dL (ref 8.7–10.2)
Chloride: 105 mmol/L (ref 96–106)
Creatinine, Ser: 0.75 mg/dL (ref 0.57–1.00)
Globulin, Total: 2.9 g/dL (ref 1.5–4.5)
Glucose: 95 mg/dL (ref 70–99)
Potassium: 4.4 mmol/L (ref 3.5–5.2)
Sodium: 139 mmol/L (ref 134–144)
Total Protein: 7.1 g/dL (ref 6.0–8.5)
eGFR: 98 mL/min/{1.73_m2} (ref >59)

## 2021-06-08 LAB — CBC WITH DIFFERENTIAL/PLATELET
Basophils Absolute: 0 10*3/uL (ref 0.0–0.2)
Basos: 0 %
EOS (ABSOLUTE): 0.2 10*3/uL (ref 0.0–0.4)
Eos: 5 %
Hematocrit: 39.9 % (ref 34.0–46.6)
Hemoglobin: 12.9 g/dL (ref 11.1–15.9)
Immature Grans (Abs): 0 10*3/uL (ref 0.0–0.1)
Immature Granulocytes: 0 %
Lymphocytes Absolute: 1.8 10*3/uL (ref 0.7–3.1)
Lymphs: 42 %
MCH: 29 pg (ref 26.6–33.0)
MCHC: 32.3 g/dL (ref 31.5–35.7)
MCV: 90 fL (ref 79–97)
Monocytes Absolute: 0.3 10*3/uL (ref 0.1–0.9)
Monocytes: 8 %
Neutrophils Absolute: 1.9 10*3/uL (ref 1.4–7.0)
Neutrophils: 45 %
Platelets: 297 10*3/uL (ref 150–450)
RBC: 4.45 x10E6/uL (ref 3.77–5.28)
RDW: 13.3 % (ref 11.7–15.4)
WBC: 4.2 10*3/uL (ref 3.4–10.8)

## 2021-06-08 LAB — HEMOGLOBIN A1C
Est. average glucose Bld gHb Est-mCnc: 120 mg/dL
Hgb A1c MFr Bld: 5.8 % — ABNORMAL HIGH (ref 4.8–5.6)

## 2021-06-08 LAB — LIPID PANEL W/O CHOL/HDL RATIO
Cholesterol, Total: 139 mg/dL (ref 100–199)
HDL: 24 mg/dL — ABNORMAL LOW (ref >39)
LDL Chol Calc (NIH): 60 mg/dL (ref 0–99)
Triglycerides: 350 mg/dL — ABNORMAL HIGH (ref 0–149)
VLDL Cholesterol Cal: 55 mg/dL — ABNORMAL HIGH (ref 5–40)

## 2021-07-06 ENCOUNTER — Other Ambulatory Visit: Payer: Self-pay

## 2021-07-06 MED ORDER — OMEPRAZOLE 40 MG PO CPDR: DELAYED_RELEASE_CAPSULE | ORAL | 4 refills | Status: DC

## 2021-07-19 ENCOUNTER — Other Ambulatory Visit: Payer: Self-pay

## 2021-07-19 DIAGNOSIS — Z1231 Encounter for screening mammogram for malignant neoplasm of breast: Secondary | ICD-10-CM

## 2021-07-19 NOTE — Addendum Note (Signed)
Addended by: Narda Rutherford on: 07/19/2021 10:41 AM   Modules accepted: Orders

## 2021-08-12 ENCOUNTER — Other Ambulatory Visit: Payer: Self-pay

## 2021-08-12 ENCOUNTER — Ambulatory Visit
Admission: RE | Admit: 2021-08-12 | Discharge: 2021-08-12 | Disposition: A | Discharge status: Home / Self Care | Payer: No Typology Code available for payment source | Source: Ambulatory Visit | Attending: Internal Medicine | Admitting: Internal Medicine

## 2021-09-01 ENCOUNTER — Other Ambulatory Visit: Payer: Self-pay | Admitting: Internal Medicine

## 2021-09-01 ENCOUNTER — Encounter: Payer: Self-pay | Admitting: Internal Medicine

## 2021-09-01 ENCOUNTER — Ambulatory Visit: Payer: Self-pay | Admitting: Internal Medicine

## 2021-09-01 VITALS — BP 138/86 | HR 72 | Resp 12 | Ht 62.5 in | Wt 171.0 lb

## 2021-09-01 DIAGNOSIS — R1319 Other dysphagia: Secondary | ICD-10-CM

## 2021-09-01 DIAGNOSIS — J301 Allergic rhinitis due to pollen: Secondary | ICD-10-CM

## 2021-09-01 DIAGNOSIS — E785 Hyperlipidemia, unspecified: Secondary | ICD-10-CM

## 2021-09-01 DIAGNOSIS — Z124 Encounter for screening for malignant neoplasm of cervix: Secondary | ICD-10-CM

## 2021-09-01 DIAGNOSIS — Z0000 Encounter for general adult medical examination without abnormal findings: Secondary | ICD-10-CM

## 2021-09-01 MED ORDER — FLUTICASONE PROPIONATE 50 MCG/ACT NA SUSP: 2.0000 | Freq: Every day | NASAL | 11 refills | Status: DC

## 2021-09-01 NOTE — Progress Notes (Signed)
Subjective:    Patient ID: Haley Norton, female   DOB: Jun 30, 1970, 51 y.o.   MRN: [id number]   HPI  CPE with pap  1.  Pap:  Last pap in 07/2019 and with ASCUS, HPV negative.  Was told by Dr. Jolayne Panther to repeat in 1 year.    2.  Mammogram:  Normal recently 08/12/21.  No family history of breast cancer.   3.  Osteoprevention:  only one serving of milk daily.  Walks somewhat daily.  Does have mat and chair exercise and youtube.    4.  Guaiac Cards/FIT:  Negative in past.  Last check 07/06/2018 and negative for blood.  5.  Colonoscopy:  Never.  No family history of colon cancer.    6.  Immunizations:   Immunization History  Administered Date(s) Administered   H1N1 05/27/2008   Influenza Inj Mdck Quad Pf 07/06/2016, 02/05/2018   Influenza Split 02/16/2017   Influenza Whole 04/25/2005, 03/30/2006, 04/06/2006, 04/10/2008, 02/27/2009   Influenza,inj,Quad PF,6+ Mos 01/09/2019   Influenza-Unspecified 02/24/2010   Moderna Covid-19 Vaccine Bivalent Booster 17yrs & up 06/07/2021   PFIZER Comirnaty(Gray Top)Covid-19 Tri-Sucrose Vaccine 12/11/2020   PFIZER(Purple Top)SARS-COV-2 Vaccination 08/22/2019, 09/14/2019, 01/15/2020   PPD Test 06/14/2017   Pneumococcal Polysaccharide-23 03/30/2005, 04/25/2005   Td 09/27/2005, 10/25/2005   Tdap 12/11/2020     7.  Glucose/Cholesterol:  Prediabetes with A1C at 5.8% in January and FLP with dyslipidemia with low HDL and high trigs same date.  Started taking fish oil since labs in January.  Taking 1000 mg once daily.   Lipid Panel     Component Value Date/Time   CHOL 139 06/07/2021 0951   TRIG 350 (H) 06/07/2021 0951   HDL 24 (L) 06/07/2021 0951   CHOLHDL 7.0 (H) 05/25/2020 0938   CHOLHDL 4.2 Ratio 05/10/2010 2128   VLDL 43 (H) 05/10/2010 2128   LDLCALC 60 06/07/2021 0951   LABVLDL 55 (H) 06/07/2021 0951      Current Meds  Medication Sig   adalimumab (HUMIRA) 40 MG/0.8ML injection Inject 40 mg into the skin every 14 (fourteen)  days. On Tuesdays   ferrous sulfate 325 (65 FE) MG tablet Take 325 mg by mouth daily with breakfast.   fexofenadine (ALLEGRA) 180 MG tablet Take 1 tablet (180 mg total) by mouth daily.   montelukast (SINGULAIR) 10 MG tablet TAKE ONE TABLET BY MOUTH DAILY   omeprazole (PRILOSEC) 40 MG capsule 1 cap by mouth on empty stomach once daily   No Known Allergies  Past Medical History:  Diagnosis Date   Chronic kidney disease    Dr. Hyman Hopes   GERD (gastroesophageal reflux disease)    Rheumatic joint disease (HCC) 1997   Renal involvement as well.  Followed by [facility] Rheumatology, Dr. Hyman Hopes, Nephrology, and [facility]   Past Surgical History:  Procedure Laterality Date   CESAREAN SECTION  [phone]   2 previous c sections   TUBAL LIGATION  2006   Family History  Problem Relation Age of Onset   Stroke Mother        Just never recovered from stroke long ago.   Heart disease Mother    Diabetes Father    Social History   Socioeconomic History   Marital status: Married    Spouse name: Lynnea Ferrier   Number of children: 2   Years of education: 12   Highest education level: 12th grade  Occupational History   Occupation: Housewife  Tobacco Use   Smoking status: Never   Smokeless tobacco:  Never  Vaping Use   Vaping Use: Never used  Substance and Sexual Activity   Alcohol use: No   Drug use: No   Sexual activity: Yes    Birth control/protection: Surgical  Other Topics Concern   Not on file  Social History Narrative   Lives at home with husband and daughter, who is total care with Joellyn Quails Syndrome   Does not have a place to really get out an walk easily in neighborhood.     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   Social Determinants of Health   Financial Resource Strain: Low Risk    Difficulty of Paying Living Expenses: Not hard at all  Food Insecurity: No Food Insecurity   Worried About Programme researcher, broadcasting/film/video in the Last Year: Never true   Barista in the Last Year: Never  true  Transportation Needs: No Transportation Needs   Lack of Transportation (Medical): No   Lack of Transportation (Non-Medical): No  Physical Activity: Not on file  Stress: Not on file  Social Connections: Not on file  Intimate Partner Violence: Not At Risk   Fear of Current or Ex-Partner: No   Emotionally Abused: No   Physically Abused: No   Sexually Abused: No      Review of Systems  HENT:  Positive for postnasal drip, rhinorrhea and sneezing.        Lot of nasal congestion.  Eyes:  Positive for redness (a bit.) and itching. Negative for discharge.  Respiratory:         Mouth and throat get dry at night, then has problems breathing.  She has been told she snores, though denies being told has apneic episodes.  Sleeps on side.    Gastrointestinal:  Positive for abdominal pain (epigastrium bloated and someting stuck in her throat.  Will awaken with throat hurting in the morning when has these symptoms the day before.  When she eats fat in particular.) and nausea (sometimes in morning with acid.). Negative for blood in stool (No melena).  Musculoskeletal:        Joints much better since recovered from COVID and taking Humira.  Skin:        Joints generally fine with current treatment     Objective:   BP 138/86 (BP Location: Left Arm, Patient Position: Sitting, Cuff Size: Normal)   Pulse 72   Resp 12   Ht 5' 2.5" (1.588 m)   Wt 171 lb (77.6 kg)   LMP 12/30/2017   BMI 30.78 kg/m   Physical Exam Constitutional:      Appearance: Normal appearance.  HENT:     Head: Normocephalic and atraumatic.     Right Ear: Tympanic membrane, ear canal and external ear normal.     Left Ear: Tympanic membrane, ear canal and external ear normal.     Nose: Congestion present.     Mouth/Throat:     Mouth: Mucous membranes are moist.     Pharynx: Oropharynx is clear.  Eyes:     Extraocular Movements: Extraocular movements intact.     Pupils: Pupils are equal, round, and reactive to  light.     Comments: Discs sharp  Neck:     Thyroid: No thyroid mass or thyromegaly.  Cardiovascular:     Rate and Rhythm: Normal rate and regular rhythm.     Heart sounds: S1 normal and S2 normal. No murmur heard.    No friction rub. No S3 or S4 sounds.  Comments: No carotid bruits.  Carotid, radial, femoral, DP and PT pulses normal and equal.    Pulmonary:     Effort: Pulmonary effort is normal.     Breath sounds: Normal breath sounds.  Chest:  Breasts:    Right: No inverted nipple, mass or nipple discharge.     Left: No inverted nipple, mass or nipple discharge.  Abdominal:     General: Bowel sounds are normal.     Palpations: Abdomen is soft. There is no hepatomegaly, splenomegaly or mass.     Tenderness: There is no abdominal tenderness.     Hernia: No hernia is present.  Genitourinary:    Vagina: Normal.     Cervix: Normal.     Uterus: Normal. Not enlarged and not tender.      Adnexa:        Right: No mass or tenderness.         Left: No mass or tenderness.    Musculoskeletal:        General: No deformity. Normal range of motion.     Cervical back: Normal range of motion and neck supple.     Right lower leg: No edema.     Left lower leg: No edema.  Lymphadenopathy:     Head:     Right side of head: No submental or submandibular adenopathy.     Left side of head: No submental or submandibular adenopathy.     Cervical: No cervical adenopathy.     Upper Body:     Right upper body: No supraclavicular or axillary adenopathy.     Left upper body: No supraclavicular or axillary adenopathy.     Lower Body: No right inguinal adenopathy. No left inguinal adenopathy.  Skin:    General: Skin is warm.     Capillary Refill: Capillary refill takes less than 2 seconds.     Findings: No rash.  Neurological:     General: No focal deficit present.     Mental Status: She is alert and oriented to person, place, and time.     Cranial Nerves: Cranial nerves 2-12 are intact.      Sensory: Sensation is intact.     Motor: Motor function is intact.     Coordination: Coordination is intact.     Gait: Gait is intact.     Deep Tendon Reflexes: Reflexes are normal and symmetric.  Psychiatric:        Mood and Affect: Mood normal.        Behavior: Behavior normal.      Assessment & Plan   CPE History of ASCUS with negative HPV 2021 and was encouraged to have pap in 1 year.  Pap performed today.  2.  Dyslipidemia:  increase fish oil to 1000 mg twice daily. Recheck FLP in 3 months.  3.  Prediabetes:  recheck A1C in 3 months.  4.  GERD:  triggered by fatty/greasy food.  Encouraged avoiding those foods.  If symptoms become more problematic, consider H2 blocker.    5.  Allergies:  Flonase.

## 2021-09-02 LAB — CYTOLOGY - PAP

## 2021-10-20 ENCOUNTER — Other Ambulatory Visit: Payer: Self-pay

## 2021-10-20 DIAGNOSIS — Z1211 Encounter for screening for malignant neoplasm of colon: Secondary | ICD-10-CM

## 2021-10-20 LAB — POC FIT TEST STOOL: Fecal Occult Blood: NEGATIVE

## 2021-11-29 ENCOUNTER — Other Ambulatory Visit: Payer: Self-pay

## 2021-11-29 DIAGNOSIS — E785 Hyperlipidemia, unspecified: Secondary | ICD-10-CM

## 2021-11-29 DIAGNOSIS — Z79899 Other long term (current) drug therapy: Secondary | ICD-10-CM

## 2021-11-29 DIAGNOSIS — R7309 Other abnormal glucose: Secondary | ICD-10-CM

## 2021-11-30 LAB — COMPREHENSIVE METABOLIC PANEL
ALT: 17 IU/L (ref 0–32)
AST: 21 IU/L (ref 0–40)
Albumin/Globulin Ratio: 1.2 (ref 1.2–2.2)
Albumin: 3.8 g/dL (ref 3.8–4.8)
Alkaline Phosphatase: 58 IU/L (ref 44–121)
BUN/Creatinine Ratio: 11 (ref 9–23)
BUN: 9 mg/dL (ref 6–24)
Bilirubin Total: 0.6 mg/dL (ref 0.0–1.2)
CO2: 23 mmol/L (ref 20–29)
Calcium: 8.8 mg/dL (ref 8.7–10.2)
Chloride: 105 mmol/L (ref 96–106)
Creatinine, Ser: 0.83 mg/dL (ref 0.57–1.00)
Globulin, Total: 3.1 g/dL (ref 1.5–4.5)
Glucose: 99 mg/dL (ref 70–99)
Potassium: 4.3 mmol/L (ref 3.5–5.2)
Sodium: 140 mmol/L (ref 134–144)
Total Protein: 6.9 g/dL (ref 6.0–8.5)
eGFR: 86 mL/min/{1.73_m2} (ref >59)

## 2021-11-30 LAB — CBC WITH DIFFERENTIAL/PLATELET
Basophils Absolute: 0 10*3/uL (ref 0.0–0.2)
Basos: 0 %
EOS (ABSOLUTE): 0.2 10*3/uL (ref 0.0–0.4)
Eos: 5 %
Hematocrit: 36.9 % (ref 34.0–46.6)
Hemoglobin: 12.5 g/dL (ref 11.1–15.9)
Immature Grans (Abs): 0 10*3/uL (ref 0.0–0.1)
Immature Granulocytes: 0 %
Lymphocytes Absolute: 1.6 10*3/uL (ref 0.7–3.1)
Lymphs: 44 %
MCH: 30.2 pg (ref 26.6–33.0)
MCHC: 33.9 g/dL (ref 31.5–35.7)
MCV: 89 fL (ref 79–97)
Monocytes Absolute: 0.3 10*3/uL (ref 0.1–0.9)
Monocytes: 8 %
Neutrophils Absolute: 1.5 10*3/uL (ref 1.4–7.0)
Neutrophils: 43 %
Platelets: 283 10*3/uL (ref 150–450)
RBC: 4.14 x10E6/uL (ref 3.77–5.28)
RDW: 13.1 % (ref 11.7–15.4)
WBC: 3.6 10*3/uL (ref 3.4–10.8)

## 2021-11-30 LAB — HEMOGLOBIN A1C
Est. average glucose Bld gHb Est-mCnc: 117 mg/dL
Hgb A1c MFr Bld: 5.7 % — ABNORMAL HIGH (ref 4.8–5.6)

## 2021-11-30 LAB — LIPID PANEL W/O CHOL/HDL RATIO
Cholesterol, Total: 156 mg/dL (ref 100–199)
HDL: 22 mg/dL — ABNORMAL LOW (ref >39)
LDL Chol Calc (NIH): 65 mg/dL (ref 0–99)
Triglycerides: 446 mg/dL — ABNORMAL HIGH (ref 0–149)
VLDL Cholesterol Cal: 69 mg/dL — ABNORMAL HIGH (ref 5–40)

## 2021-12-06 ENCOUNTER — Ambulatory Visit: Payer: Self-pay | Admitting: Internal Medicine

## 2021-12-06 ENCOUNTER — Encounter: Payer: Self-pay | Admitting: Internal Medicine

## 2021-12-06 VITALS — BP 124/84 | HR 72 | Resp 12 | Ht 63.0 in | Wt 173.5 lb

## 2021-12-06 DIAGNOSIS — R1319 Other dysphagia: Secondary | ICD-10-CM | POA: Insufficient documentation

## 2021-12-06 DIAGNOSIS — R7303 Prediabetes: Secondary | ICD-10-CM

## 2021-12-06 DIAGNOSIS — M0579 Rheumatoid arthritis with rheumatoid factor of multiple sites without organ or systems involvement: Secondary | ICD-10-CM | POA: Insufficient documentation

## 2021-12-06 DIAGNOSIS — E785 Hyperlipidemia, unspecified: Secondary | ICD-10-CM

## 2021-12-06 MED ORDER — OMEPRAZOLE 40 MG PO CPDR: DELAYED_RELEASE_CAPSULE | ORAL | 11 refills | Status: DC

## 2021-12-06 NOTE — Patient Instructions (Signed)
[facility] [address], [place] [address]  [phone] Hours of Operation Mondays to Thursdays: 8 am to 8 pm Fridays: 9 am to 8 pm Saturdays: 9 am to 1 pm Sundays: Closed   [facility] [address]  [place], [place] [address] [phone] 40% Discount for [employer] Patients

## 2021-12-06 NOTE — Progress Notes (Signed)
Subjective:    Patient ID: Haley Norton, female   DOB: [birth year], 51 y.o.   MRN: [id number]   HPI  Mayer Camel interprets, then Tildon Husky   Prediabetes:  A1C down to 5.7%.  Discussed almost in normal range.  States she is eating healthy and being physically active.  2.  RA:  having good control with Humira.  Is seen every 6 months by Rheumatology, Dr. Erroll Luna.  Rheum reportedly able to see labs done here.    3.  Hypertriglyceridemia:  states she is taking fish oil 1200 mg twice daily.  Never misses.  Purchases at [facility].  Walks 15-20 minutes daily.  Depends if her knees bother her.  Sometimes will walk 2-3 times daily at 15 minutes each.    4.  Problems breathing, mainly at night, but has also happened with eating:  Points to her throat initially, but then states also in chest.  Tries to take deep breaths, but doesn't seem to help.  Keeps her up.  States she brought this up at her physical, but she focused then on her epigastrium and the symptoms seemed to occur after fatty meals.  We had discussed avoiding fatty meals instead of acid reduction.  She states the symptoms improved, but have recurred.       Also with dyspnea if walks for a long time. Continues to take iron supplement, though no longer with periods and anemia resolved since 2021. She is taking omeprazole at different times throughout day and often not on empty stomach. No tobacco or alcohol Rare chocolate. Does eat tomatoes and onions. Rare soda. Does drink a lot of coffee. Rare NSAID  Current Meds  Medication Sig   adalimumab (HUMIRA) 40 MG/0.8ML injection Inject 40 mg into the skin every 14 (fourteen) days. On Tuesdays   ferrous sulfate 325 (65 FE) MG tablet Take 325 mg by mouth daily with breakfast.   fexofenadine (ALLEGRA) 180 MG tablet Take 1 tablet (180 mg total) by mouth daily.   fluticasone (FLONASE) 50 MCG/ACT nasal spray Place 2 sprays into both nostrils daily.   montelukast (SINGULAIR)  10 MG tablet TAKE ONE TABLET BY MOUTH DAILY   Omega-3 Fatty Acids (FISH OIL) 1200 MG CAPS Take 1 capsule by mouth 2 (two) times daily.   omeprazole (PRILOSEC) 40 MG capsule 1 cap by mouth on empty stomach once daily   No Known Allergies   Review of Systems    Objective:   BP 124/84 (BP Location: Right Arm, Patient Position: Sitting, Cuff Size: Normal)   Pulse 72   Resp 12   Ht 5\' 3"  (1.6 m)   Wt 173 lb 8 oz (78.7 kg)   LMP  (LMP Unknown) Comment: No period since September 2019  BMI 30.73 kg/m   Physical Exam NAD Lungs:  CTA CV:  RRR without murmur or rub.  Radial and DP pulses normal and equal Abd:  S, NT, No HSM or mass, + BS   Assessment & Plan    Prediabetes:  better sugar control.  CPM  2.  RA:  controlled on Humira.  Labs look good.  Repeat labs in 3 months.  3.  Dyslipidemia with high trigs:  Will check to see if can afford fish oil with [facility].  Increase to 2000 mg twice daily.  Repeat FLP in 3 months.    4.  Episodes of dyspnea that may be related to acid reflux:  Discussed cutting back caffeine, onions and tomatoes.  To  take omeprazole the same time every day at bedtime on empty stomach.  Elevate HOB.  Stay away also from fatty foods.  Follow up in 1 month

## 2022-01-10 ENCOUNTER — Ambulatory Visit: Payer: Self-pay | Admitting: Internal Medicine

## 2022-01-25 ENCOUNTER — Encounter: Payer: Self-pay | Admitting: Internal Medicine

## 2022-01-25 ENCOUNTER — Ambulatory Visit: Payer: Self-pay | Admitting: Internal Medicine

## 2022-01-25 VITALS — BP 130/80 | HR 72 | Resp 12 | Ht 63.0 in | Wt 173.0 lb

## 2022-01-25 DIAGNOSIS — R0600 Dyspnea, unspecified: Secondary | ICD-10-CM

## 2022-01-25 DIAGNOSIS — R5383 Other fatigue: Secondary | ICD-10-CM

## 2022-01-25 DIAGNOSIS — R1319 Other dysphagia: Secondary | ICD-10-CM

## 2022-01-25 NOTE — Progress Notes (Signed)
    Subjective:    Patient ID: Haley Norton, female   DOB: June 03, 1970, 51 y.o.   MRN: [id number]   HPI  Haley Norton interprets  RA:  states had visit with Rheum, Dr. Erroll Luna last month and everything stable.  Has not had visit with renal specialist in a year.  Dr. Hyman Hopes left and needs to establish with another specialist at [facility] letter to do so.    2.  Dyspnea, dysphagia, and GERD:  Started taking Omeprazole on empty stomach and at same time and made dietary changes to improve GERD and her symptoms with heartburn and dyspnea have improved tremendously.  3.  Fatigue for 1 week:  Feels very hard for her to get up in morning.  Has felt this way for a week.  No body aches, fever, cough, sore throat.  Once up and about, feels better and then once afternoon comes, she is very tired again.  No weight change.  No constipation.  No skin changes.   She is having some days where has difficulty falling asleep.  States her fatigue is definitely worse the following morning.   When can't fall asleep--gets up and walks around living room.  Also, has to care for her daughter, young adult who is wheelchair bound with Haley Norton syndrome, and who recently had to have abdominal surgery for what sounds like volvulus of colon.  Surgery was end of May.  Daughter requires total care by her mom.   Main issue is initiating sleep.  Does have to get up at midnight to provide care quickly for her daughter and then back to bed until 6 a.m. No caffeine intake later in day.   Walks outside every day.    Recent CBC and CMP were normal No melena or hematochezia.  Has not had a period in some time.      Current Meds  Medication Sig   adalimumab (HUMIRA) 40 MG/0.8ML injection Inject 40 mg into the skin every 14 (fourteen) days. On Tuesdays   fexofenadine (ALLEGRA) 180 MG tablet Take 1 tablet (180 mg total) by mouth daily.   fluticasone (FLONASE) 50 MCG/ACT nasal spray Place 2 sprays into  both nostrils daily.   montelukast (SINGULAIR) 10 MG tablet TAKE ONE TABLET BY MOUTH DAILY   Omega-3 Fatty Acids (FISH OIL) 1200 MG CAPS Take 1 capsule by mouth 2 (two) times daily.   omeprazole (PRILOSEC) 40 MG capsule 1 cap by mouth on empty stomach at bedtime once daily   No Known Allergies   Review of Systems    Objective:   BP 130/80 (BP Location: Left Arm, Patient Position: Sitting, Cuff Size: Normal)   Pulse 72   Resp 12   Ht 5\' 3"  (1.6 m)   Wt 173 lb (78.5 kg)   LMP  (LMP Unknown) Comment: No period since September 2019  BMI 30.65 kg/m   Physical Exam NAD HEENT:  PERRL, EOMI Neck: Supple, No adenopathy, no thyromegaly or mass. Chest: CTA CV:  RRR without murmur or rub.  Radial pulses normal and equal. ABd:  S, NT, No HSM or mass, + BS LE:  No edema.   Assessment & Plan    GERD with dysphagia and dyspnea:  much improved with dietary changes and taking Omeprazole more appropriately.  2.  Fatigue:  sounds related to problems with sleep.  Went over good sleep hygiene.  TSH as well.  Call if does not improve

## 2022-01-26 LAB — TSH: TSH: 2.11 u[IU]/mL (ref 0.450–4.500)

## 2022-02-23 ENCOUNTER — Other Ambulatory Visit: Payer: Self-pay

## 2022-02-23 DIAGNOSIS — R300 Dysuria: Secondary | ICD-10-CM

## 2022-02-23 LAB — POCT URINALYSIS DIPSTICK
Bilirubin, UA: NEGATIVE
Blood, UA: POSITIVE
Glucose, UA: NEGATIVE
Ketones, UA: NEGATIVE
Nitrite, UA: NEGATIVE
Protein, UA: NEGATIVE
Spec Grav, UA: 1.015 (ref 1.010–1.025)
Urobilinogen, UA: 0.2 E.U./dL
pH, UA: 6 (ref 5.0–8.0)

## 2022-02-23 MED ORDER — CIPROFLOXACIN HCL 500 MG PO TABS: 500.0000 mg | ORAL_TABLET | Freq: Two times a day (BID) | ORAL | 0 refills | Status: AC

## 2022-02-23 NOTE — Progress Notes (Signed)
Patient has been experiencing burning while urinating and burning in her genital area. Has had issue for about a week. Has used AZO which did not help any. No odor or discharge.  UA showed that patients urine was positive for blood. Sample will be sent for culture.   After discussing with Dr Amil Amen, Cipro 500mg  BID for 3 days has been sent to the pharmacy. Patient instructed to call if symptoms are not resolved after completing medication.

## 2022-02-26 LAB — URINE CULTURE

## 2022-03-08 ENCOUNTER — Other Ambulatory Visit: Payer: Self-pay

## 2022-03-16 ENCOUNTER — Ambulatory Visit: Payer: Self-pay | Admitting: Internal Medicine

## 2022-04-15 ENCOUNTER — Other Ambulatory Visit: Payer: Self-pay

## 2022-04-26 ENCOUNTER — Other Ambulatory Visit: Payer: Self-pay

## 2022-04-27 ENCOUNTER — Ambulatory Visit: Payer: Self-pay | Admitting: Internal Medicine

## 2022-05-26 ENCOUNTER — Other Ambulatory Visit: Payer: Self-pay

## 2022-05-26 DIAGNOSIS — E785 Hyperlipidemia, unspecified: Secondary | ICD-10-CM

## 2022-05-26 DIAGNOSIS — Z79899 Other long term (current) drug therapy: Secondary | ICD-10-CM

## 2022-05-27 LAB — CBC WITH DIFFERENTIAL/PLATELET
Basophils Absolute: 0 10*3/uL (ref 0.0–0.2)
Basos: 0 %
EOS (ABSOLUTE): 0.3 10*3/uL (ref 0.0–0.4)
Eos: 6 %
Hematocrit: 39.2 % (ref 34.0–46.6)
Hemoglobin: 12.7 g/dL (ref 11.1–15.9)
Immature Grans (Abs): 0 10*3/uL (ref 0.0–0.1)
Immature Granulocytes: 0 %
Lymphocytes Absolute: 1.8 10*3/uL (ref 0.7–3.1)
Lymphs: 35 %
MCH: 28.5 pg (ref 26.6–33.0)
MCHC: 32.4 g/dL (ref 31.5–35.7)
MCV: 88 fL (ref 79–97)
Monocytes Absolute: 0.6 10*3/uL (ref 0.1–0.9)
Monocytes: 11 %
Neutrophils Absolute: 2.6 10*3/uL (ref 1.4–7.0)
Neutrophils: 48 %
Platelets: 317 10*3/uL (ref 150–450)
RBC: 4.46 x10E6/uL (ref 3.77–5.28)
RDW: 13.1 % (ref 11.7–15.4)
WBC: 5.3 10*3/uL (ref 3.4–10.8)

## 2022-05-27 LAB — COMPREHENSIVE METABOLIC PANEL
ALT: 20 IU/L (ref 0–32)
AST: 27 IU/L (ref 0–40)
Albumin/Globulin Ratio: 1.2 (ref 1.2–2.2)
Albumin: 4.1 g/dL (ref 3.9–4.9)
Alkaline Phosphatase: 60 IU/L (ref 44–121)
BUN/Creatinine Ratio: 14 (ref 9–23)
BUN: 9 mg/dL (ref 6–24)
Bilirubin Total: 0.6 mg/dL (ref 0.0–1.2)
CO2: 21 mmol/L (ref 20–29)
Calcium: 9.2 mg/dL (ref 8.7–10.2)
Chloride: 105 mmol/L (ref 96–106)
Creatinine, Ser: 0.66 mg/dL (ref 0.57–1.00)
Globulin, Total: 3.3 g/dL (ref 1.5–4.5)
Glucose: 100 mg/dL — ABNORMAL HIGH (ref 70–99)
Potassium: 4 mmol/L (ref 3.5–5.2)
Sodium: 140 mmol/L (ref 134–144)
Total Protein: 7.4 g/dL (ref 6.0–8.5)
eGFR: 107 mL/min/{1.73_m2} (ref >59)

## 2022-05-27 LAB — LIPID PANEL W/O CHOL/HDL RATIO
Cholesterol, Total: 143 mg/dL (ref 100–199)
HDL: 23 mg/dL — ABNORMAL LOW (ref >39)
LDL Chol Calc (NIH): 58 mg/dL (ref 0–99)
Triglycerides: 401 mg/dL — ABNORMAL HIGH (ref 0–149)
VLDL Cholesterol Cal: 62 mg/dL — ABNORMAL HIGH (ref 5–40)

## 2022-05-31 ENCOUNTER — Ambulatory Visit: Payer: Self-pay | Admitting: Internal Medicine

## 2022-05-31 ENCOUNTER — Encounter: Payer: Self-pay | Admitting: Internal Medicine

## 2022-05-31 VITALS — BP 128/82 | HR 80 | Resp 16 | Ht 63.0 in | Wt 169.0 lb

## 2022-05-31 DIAGNOSIS — F439 Reaction to severe stress, unspecified: Secondary | ICD-10-CM

## 2022-05-31 DIAGNOSIS — E785 Hyperlipidemia, unspecified: Secondary | ICD-10-CM

## 2022-05-31 DIAGNOSIS — M0579 Rheumatoid arthritis with rheumatoid factor of multiple sites without organ or systems involvement: Secondary | ICD-10-CM

## 2022-05-31 DIAGNOSIS — R7303 Prediabetes: Secondary | ICD-10-CM

## 2022-05-31 DIAGNOSIS — G44209 Tension-type headache, unspecified, not intractable: Secondary | ICD-10-CM

## 2022-05-31 MED ORDER — OMEGA-3 FATTY ACIDS 1000 MG PO CAPS: ORAL_CAPSULE | ORAL | Status: DC

## 2022-05-31 NOTE — Progress Notes (Signed)
Subjective:    Patient ID: Haley Norton, female   DOB: 07-15-1970, 52 y.o.   MRN: [id number]   HPI  Karen Kays interprets   Headaches:  Has had headache for 3 days.  Bifrontal and extends to temporal/parietal area.  Describes pain as a pressure in.  States pain wakes her from sleep around 4 or 5 a.m.  She generally goes to bed at 9 p.m. and is up by 6 a.m.  Taking Ibuprofen 400 mg for the headache with improvement.  She is only using once daily with breakfast in morning..  States headache actually gone 3 hours after taking.  Goes without headache during the afternoon until awakens in the early morning hours.  Has had this headache before, just not for this long.   No headache this morning--last was yesterday or Monday morning. Did have associated nausea the first day of headache.  May have had some mild light sensitivity.   No change in mattress or pillows recently She does acknowledge stress recently regarding her 89 yo daughter--was taking her back and forth to her doctor (has Dandy-Walker syndrome) and ultimately ended up with pneumonia and septic shock for which she was hospitalized end of November.  She has had a very slow improvement since and mom states it's hard to discern what is going on as she cannot communicate. . Her son and husband will cover for her so she can get rest with the care of her daughter.  2.  Rheumatoid arthritis:  has developed changes to her PIP and DIP joints in a number of fingers.  She was off her humira during illness with COVID about 7 months ago, and noted changes to her finger joints about 6 months ago with inflammation and deformities.  The inflammation has been improved since getting back on Humira after about 1 month.  She states her Rheumatologist did see the inflammation of her finger joints.  No change in treatment was recommended per patient--just to restart Humira.   CBC and CMP normal save for slightly elevated glucose last month.  3.   Prediabetes:  A1C was slightly elevated at 5.7% in July with fasting glucose in December of 100.    4.  Dyslipidemia:  Trigs of 401 and HDL low at 23, LDL ant total good at 58 and 143 respectively.  She sometimes forgets to take her Fish oil 1200 mg twice daily.  Current Meds  Medication Sig   adalimumab (HUMIRA) 40 MG/0.8ML injection Inject 40 mg into the skin every 14 (fourteen) days. On Tuesdays   fexofenadine (ALLEGRA) 180 MG tablet Take 1 tablet (180 mg total) by mouth daily.   fluticasone (FLONASE) 50 MCG/ACT nasal spray Place 2 sprays into both nostrils daily.   montelukast (SINGULAIR) 10 MG tablet TAKE ONE TABLET BY MOUTH DAILY   Omega-3 Fatty Acids (FISH OIL) 1200 MG CAPS Take 1 capsule by mouth 2 (two) times daily.   No Known Allergies   Review of Systems    Objective:   BP 128/82 (BP Location: Right Arm, Patient Position: Sitting, Cuff Size: Normal)   Pulse 80   Resp 16   Ht 5\' 3"  (1.6 m)   Wt 169 lb (76.7 kg)   LMP  (LMP Unknown) Comment: No period since September 2019  BMI 29.94 kg/m   Physical Exam NAD HEENT:  PERRL, EOMI, discs sharp.   Neck:  Supple, No adenopathy.  NT over traps, cervical paraspinous musculature. Chest:  CTA CV:  RRR without murmur  or rub.  Radial pulses normal and equal Abd:  S, NT, No HSM or mass, + BS Neuro:  A & O x 3, CN II-XII grossly intact.  Motor 5/5 and DTRs 2+/4 throughout.   Swan neck deformities of index fingers.  Thickening of DIP joints and especially, right little PIP.  Also with what appears to be a nodule on right ring finger PIP and extensor surface of left elbow.    Assessment & Plan   Tension headache:  seems to have resolved, but suggested considering calming activity before sleep--yoga or meditation, warm bath, etc.  Also to be certain someone is giving her regular time to relax during her week.  Encouraged getting involved with [employer], especially once daughter is in school.  May take another dose of ibuprofen  at bedtime with snack if symptoms recur.  Make sure she has a good pillow supportive of neck.  2.  RA:  as per Dr. Graylin Shiver.  Changes to joints apparently improved once back on Humira.   CBC, CMP recently normal.  3.  Dyslipidemia:  Encouraged her to increase her fish oil to 2000 mg twice daily and never miss.  Also recommended exercise bike or elliptical trainer daily. Or water exercise if can find a place to do so with warm enough water.  Recheck FLP in JUne.  4.  Prediabetes:  Blood glucose almost normal at 100 fasting.  Recheck A1C prior to CPE in June.

## 2022-06-22 ENCOUNTER — Telehealth: Payer: Self-pay

## 2022-06-22 DIAGNOSIS — M0579 Rheumatoid arthritis with rheumatoid factor of multiple sites without organ or systems involvement: Secondary | ICD-10-CM

## 2022-06-22 NOTE — Telephone Encounter (Signed)
Patient called to ask for rheumatology referral to [facility] after her provider moved out of the [place] area.

## 2022-06-23 ENCOUNTER — Telehealth: Payer: Self-pay | Admitting: Internal Medicine

## 2022-06-23 NOTE — Telephone Encounter (Signed)
Error

## 2022-06-23 NOTE — Telephone Encounter (Signed)
Patient agreeable to move to [facility] and apply for financial assistance for lower cost of care and perhaps easier transportation.

## 2022-06-28 ENCOUNTER — Other Ambulatory Visit: Payer: Self-pay

## 2022-06-28 DIAGNOSIS — R300 Dysuria: Secondary | ICD-10-CM

## 2022-06-28 LAB — POCT URINALYSIS DIPSTICK
Bilirubin, UA: NEGATIVE
Glucose, UA: NEGATIVE
Ketones, UA: NEGATIVE
Leukocytes, UA: NEGATIVE
Nitrite, UA: NEGATIVE
Protein, UA: NEGATIVE
Spec Grav, UA: 1.02 (ref 1.010–1.025)
Urobilinogen, UA: 0.2 E.U./dL
pH, UA: 6 (ref 5.0–8.0)

## 2022-06-28 MED ORDER — NITROFURANTOIN MONOHYD MACRO 100 MG PO CAPS: ORAL_CAPSULE | ORAL | 0 refills | Status: DC

## 2022-06-28 NOTE — Progress Notes (Unsigned)
E coli in September and treated with Cipro. Was also sensitive to [employer] in 3 day course of macrodantin Have her call in progress after 1 week or sooner if not getting better.  Culture urine with symptoms. She chronically had microscopic hematuria.

## 2022-06-28 NOTE — Progress Notes (Signed)
Patient came in with complaint of burning while urinating and increased urinary frequency. Patient has not taken any medication for this issue. Symptoms started about 3 or 4 days ago.

## 2022-07-01 LAB — URINE CULTURE

## 2022-07-05 NOTE — Telephone Encounter (Signed)
Patient has been notified of referral

## 2022-10-11 ENCOUNTER — Encounter: Payer: Self-pay | Admitting: Internal Medicine

## 2022-10-27 ENCOUNTER — Other Ambulatory Visit: Payer: Self-pay

## 2022-11-01 ENCOUNTER — Other Ambulatory Visit: Payer: Self-pay | Admitting: Internal Medicine

## 2022-11-01 ENCOUNTER — Encounter: Payer: Self-pay | Admitting: Internal Medicine

## 2022-11-01 DIAGNOSIS — Z1231 Encounter for screening mammogram for malignant neoplasm of breast: Secondary | ICD-10-CM

## 2022-11-17 ENCOUNTER — Other Ambulatory Visit: Payer: Self-pay

## 2022-11-17 DIAGNOSIS — Z79899 Other long term (current) drug therapy: Secondary | ICD-10-CM

## 2022-11-17 DIAGNOSIS — M0579 Rheumatoid arthritis with rheumatoid factor of multiple sites without organ or systems involvement: Secondary | ICD-10-CM

## 2022-11-18 LAB — CBC WITH DIFFERENTIAL/PLATELET
Basophils Absolute: 0 10*3/uL (ref 0.0–0.2)
Basos: 0 %
EOS (ABSOLUTE): 0.2 10*3/uL (ref 0.0–0.4)
Eos: 5 %
Hematocrit: 38.6 % (ref 34.0–46.6)
Hemoglobin: 12.4 g/dL (ref 11.1–15.9)
Immature Grans (Abs): 0 10*3/uL (ref 0.0–0.1)
Immature Granulocytes: 0 %
Lymphocytes Absolute: 2.1 10*3/uL (ref 0.7–3.1)
Lymphs: 42 %
MCH: 28.4 pg (ref 26.6–33.0)
MCHC: 32.1 g/dL (ref 31.5–35.7)
MCV: 89 fL (ref 79–97)
Monocytes Absolute: 0.4 10*3/uL (ref 0.1–0.9)
Monocytes: 8 %
Neutrophils Absolute: 2.2 10*3/uL (ref 1.4–7.0)
Neutrophils: 45 %
Platelets: 310 10*3/uL (ref 150–450)
RBC: 4.36 x10E6/uL (ref 3.77–5.28)
RDW: 13.3 % (ref 11.7–15.4)
WBC: 4.9 10*3/uL (ref 3.4–10.8)

## 2022-11-18 LAB — HEPATIC FUNCTION PANEL
ALT: 15 IU/L (ref 0–32)
AST: 18 IU/L (ref 0–40)
Albumin: 3.9 g/dL (ref 3.9–4.9)
Alkaline Phosphatase: 60 IU/L (ref 44–121)
Bilirubin Total: 0.5 mg/dL (ref 0.0–1.2)
Bilirubin, Direct: 0.12 mg/dL (ref 0.00–0.40)
Total Protein: 7.2 g/dL (ref 6.0–8.5)

## 2022-11-18 LAB — CREATININE, SERUM
Creatinine, Ser: 0.74 mg/dL (ref 0.57–1.00)
eGFR: 99 mL/min/{1.73_m2} (ref >59)

## 2022-11-24 ENCOUNTER — Ambulatory Visit
Admission: RE | Admit: 2022-11-24 | Discharge: 2022-11-24 | Disposition: A | Discharge status: Home / Self Care | Payer: No Typology Code available for payment source | Source: Ambulatory Visit | Attending: Internal Medicine | Admitting: Internal Medicine

## 2022-11-24 DIAGNOSIS — Z1231 Encounter for screening mammogram for malignant neoplasm of breast: Secondary | ICD-10-CM

## 2023-01-02 ENCOUNTER — Encounter: Payer: Self-pay | Admitting: Internal Medicine

## 2023-01-02 ENCOUNTER — Ambulatory Visit: Payer: Self-pay | Admitting: Internal Medicine

## 2023-01-02 VITALS — BP 130/80 | HR 88 | Resp 14 | Ht 63.0 in | Wt 173.5 lb

## 2023-01-02 DIAGNOSIS — R300 Dysuria: Secondary | ICD-10-CM

## 2023-01-02 DIAGNOSIS — N949 Unspecified condition associated with female genital organs and menstrual cycle: Secondary | ICD-10-CM

## 2023-01-02 DIAGNOSIS — R3129 Other microscopic hematuria: Secondary | ICD-10-CM

## 2023-01-02 LAB — POCT URINALYSIS DIPSTICK
Bilirubin, UA: NEGATIVE
Glucose, UA: NEGATIVE
Nitrite, UA: NEGATIVE
Protein, UA: POSITIVE — AB
Spec Grav, UA: 1.01 (ref 1.010–1.025)
Urobilinogen, UA: 0.2 E.U./dL
pH, UA: 6 (ref 5.0–8.0)

## 2023-01-02 LAB — POCT WET PREP WITH KOH
Clue Cells Wet Prep HPF POC: NEGATIVE
KOH Prep POC: NEGATIVE
Trichomonas, UA: NEGATIVE
Yeast Wet Prep HPF POC: NEGATIVE

## 2023-01-02 MED ORDER — CIPROFLOXACIN HCL 500 MG PO TABS: 500.0000 mg | ORAL_TABLET | Freq: Two times a day (BID) | ORAL | 0 refills | Status: AC

## 2023-01-02 MED ORDER — PHENAZOPYRIDINE HCL 200 MG PO TABS: 200.0000 mg | ORAL_TABLET | Freq: Three times a day (TID) | ORAL | 0 refills | Status: DC | PRN

## 2023-01-02 NOTE — Progress Notes (Signed)
    Subjective:    Patient ID: Haley Norton, female   DOB: 18-May-1971, 52 y.o.   MRN: [id number]   HPI  Haley Norton interprets  Four days ago, started with vaginal burning and mild itching without discharge or odor and mild urinary burning.  Did have intercourse right before her symptoms started.  Mild urinary frequency Very low back pain bilaterally--points to low buttocks.   No fever.   Has had nausea, but no vomiting or diarrhea.   Has been using Monistat nightly for 2 nights.  Has helped a bit with the burning.    Current Meds  Medication Sig   adalimumab (HUMIRA) 40 MG/0.8ML injection Inject 40 mg into the skin every 14 (fourteen) days. On Tuesdays   fexofenadine (ALLEGRA) 180 MG tablet Take 1 tablet (180 mg total) by mouth daily.   fish oil-omega-3 fatty acids 1000 MG capsule 1 cap by mouth twice daily.   fluticasone (FLONASE) 50 MCG/ACT nasal spray Place 2 sprays into both nostrils daily.   montelukast (SINGULAIR) 10 MG tablet TAKE ONE TABLET BY MOUTH DAILY   No Known Allergies   Review of Systems    Objective:   BP 130/80 (BP Location: Left Arm, Patient Position: Sitting, Cuff Size: Normal)   Pulse 88   Resp 14   Ht 5\' 3"  (1.6 m)   Wt 173 lb 8 oz (78.7 kg)   LMP  (LMP Unknown) Comment: No period since September 2019  BMI 30.73 kg/m   Physical Exam Constitutional:      Appearance: Normal appearance.  HENT:     Head: Normocephalic and atraumatic.  Cardiovascular:     Rate and Rhythm: Normal rate and regular rhythm.     Pulses: Normal pulses.     Heart sounds: Normal heart sounds. No murmur heard.    No friction rub.  Pulmonary:     Effort: Pulmonary effort is normal.     Breath sounds: Normal breath sounds.  Abdominal:     General: Bowel sounds are normal.     Palpations: Abdomen is soft. There is no mass.     Tenderness: There is abdominal tenderness (Suprapubic area with mild tenderness and bilateral lower quadrants to lesser degree.  No  rebound or peritoneal signs.  No CVA tenderness.).     Hernia: No hernia is present.  Genitourinary:    General: Normal vulva.     Comments: Normal external female genitalia. Scant white cream in vaginal canal. Mild cervical and vaginal mucosa atrophy.   No discharge.   No CMT.   Neurological:     Mental Status: She is alert.      Assessment & Plan   Dysuria and vaginal burning:  Wet prep abnormal due to vaginal cream.  Perhaps had yeast infection prior to start of treatments.  She does have large amt of microscopic hematuria.  Start Cipro 500 mg twice daily and send for urine culture.  Also send GC/chlamydia.  Finish 3day treatment with Monistat.

## 2023-02-03 ENCOUNTER — Other Ambulatory Visit: Payer: Self-pay

## 2023-02-03 DIAGNOSIS — E785 Hyperlipidemia, unspecified: Secondary | ICD-10-CM

## 2023-02-03 DIAGNOSIS — R7303 Prediabetes: Secondary | ICD-10-CM

## 2023-02-03 DIAGNOSIS — R5383 Other fatigue: Secondary | ICD-10-CM

## 2023-02-04 LAB — COMPREHENSIVE METABOLIC PANEL
ALT: 14 IU/L (ref 0–32)
AST: 19 IU/L (ref 0–40)
Albumin: 3.9 g/dL (ref 3.8–4.9)
Alkaline Phosphatase: 60 IU/L (ref 44–121)
BUN/Creatinine Ratio: 16 (ref 9–23)
BUN: 11 mg/dL (ref 6–24)
Bilirubin Total: 0.6 mg/dL (ref 0.0–1.2)
CO2: 22 mmol/L (ref 20–29)
Calcium: 9 mg/dL (ref 8.7–10.2)
Chloride: 104 mmol/L (ref 96–106)
Creatinine, Ser: 0.68 mg/dL (ref 0.57–1.00)
Globulin, Total: 3.4 g/dL (ref 1.5–4.5)
Glucose: 96 mg/dL (ref 70–99)
Potassium: 4 mmol/L (ref 3.5–5.2)
Sodium: 139 mmol/L (ref 134–144)
Total Protein: 7.3 g/dL (ref 6.0–8.5)
eGFR: 105 mL/min/{1.73_m2} (ref >59)

## 2023-02-04 LAB — CBC WITH DIFFERENTIAL/PLATELET
Basophils Absolute: 0 10*3/uL (ref 0.0–0.2)
Basos: 0 %
EOS (ABSOLUTE): 0.4 10*3/uL (ref 0.0–0.4)
Eos: 7 %
Hematocrit: 39.6 % (ref 34.0–46.6)
Hemoglobin: 12.3 g/dL (ref 11.1–15.9)
Immature Grans (Abs): 0 10*3/uL (ref 0.0–0.1)
Immature Granulocytes: 0 %
Lymphocytes Absolute: 2.2 10*3/uL (ref 0.7–3.1)
Lymphs: 36 %
MCH: 28.6 pg (ref 26.6–33.0)
MCHC: 31.1 g/dL — ABNORMAL LOW (ref 31.5–35.7)
MCV: 92 fL (ref 79–97)
Monocytes Absolute: 0.5 10*3/uL (ref 0.1–0.9)
Monocytes: 9 %
Neutrophils Absolute: 3 10*3/uL (ref 1.4–7.0)
Neutrophils: 48 %
Platelets: 317 10*3/uL (ref 150–450)
RBC: 4.3 x10E6/uL (ref 3.77–5.28)
RDW: 13.3 % (ref 11.7–15.4)
WBC: 6.2 10*3/uL (ref 3.4–10.8)

## 2023-02-04 LAB — LIPID PANEL W/O CHOL/HDL RATIO
Cholesterol, Total: 143 mg/dL (ref 100–199)
HDL: 26 mg/dL — ABNORMAL LOW (ref >39)
LDL Chol Calc (NIH): 69 mg/dL (ref 0–99)
Triglycerides: 295 mg/dL — ABNORMAL HIGH (ref 0–149)
VLDL Cholesterol Cal: 48 mg/dL — ABNORMAL HIGH (ref 5–40)

## 2023-02-04 LAB — HEMOGLOBIN A1C
Est. average glucose Bld gHb Est-mCnc: 123 mg/dL
Hgb A1c MFr Bld: 5.9 % — ABNORMAL HIGH (ref 4.8–5.6)

## 2023-02-07 ENCOUNTER — Encounter: Payer: Self-pay | Admitting: Internal Medicine

## 2023-02-07 ENCOUNTER — Ambulatory Visit: Payer: Self-pay | Admitting: Internal Medicine

## 2023-02-07 VITALS — BP 130/78 | HR 78 | Resp 16 | Ht 62.5 in | Wt 177.0 lb

## 2023-02-07 DIAGNOSIS — R7303 Prediabetes: Secondary | ICD-10-CM

## 2023-02-07 DIAGNOSIS — Z23 Encounter for immunization: Secondary | ICD-10-CM

## 2023-02-07 DIAGNOSIS — Z0000 Encounter for general adult medical examination without abnormal findings: Secondary | ICD-10-CM

## 2023-02-07 DIAGNOSIS — E559 Vitamin D deficiency, unspecified: Secondary | ICD-10-CM

## 2023-02-07 DIAGNOSIS — M0579 Rheumatoid arthritis with rheumatoid factor of multiple sites without organ or systems involvement: Secondary | ICD-10-CM

## 2023-02-07 DIAGNOSIS — E785 Hyperlipidemia, unspecified: Secondary | ICD-10-CM

## 2023-02-07 NOTE — Patient Instructions (Addendum)
[facility] [address] [place], [place] [address]  [phone] Hours of Operation Mondays to Thursdays: 8 am to 8 pm Fridays: 9 am to 8 pm Saturdays: 9 am to 1 pm Sundays: Closed  Tome un vaso de agua antes de cada comida Tome un minimo de 6 a 8 vasos de agua diarios Coma tres veces al dia Coma una proteina y una grasa saludable con comida.  (huevos, pescado, pollo, pavo, y limite carnes rojas Coma 5 porciones diarias de legumbres.  Mezcle los colores Coma 2 porciones diarias de frutas con cascara cuando sea comestible Use platos pequeos Suelte su tenedor o cuchara despues de cada mordida hata que se mastique y se trague Come en la mesa con amigos o familiares por lo menos una vez al dia Apague la televisin y aparatos electrnicos durante la comida  Su objetivo debe ser perder una libra por semana  Estudios recientes indican que las personas quienes consumen todos de sus calorias durante 12 horas se bajan de pesocon Mas eficiencia.  Por ejemplo, si Usted come su primera comida a las 7:00 a.m., su comida final del dia se debe completar antes de las 7:00 p.m. 

## 2023-02-07 NOTE — Progress Notes (Signed)
 Subjective:    Patient ID: Haley Norton, female   DOB: 1970-07-28, 52 y.o.   MRN: [id number]   HPI  Haley Norton interprets  CPE with pap  1.  Pap:  Last performed and normal 09/01/2021.    2.  Mammogram:  Last in 10/2022 and normal.  No family history of breast cancer.    3.  Osteoprevention:  Drinks 1-2 servings of milk daily.  Drinks coconut milk too, but does not have a lot of calcium  or Vitamin D .  She does walk and stationary bike every day.  Recent Vitamin D  deficiency found with establish visit with Haley [facility] at [facility].  She has a prescription for replacement.  4.  Guaiac Cards/FIT:  Last checked 10/20/2021 and negative.  Recent CBC fine.    5.  Colonoscopy:  Never.  Haley. Allenzara has discussed setting her up with screening colonoscopy at [facility].  Not affordable for patient here as a screen.    6.  Immunizations:  Received Pneumococcal 20 with Haley. Allenzara on the 4th.   Immunization History  Administered Date(s) Administered   H1N1 05/27/2008   Influenza Inj Mdck Quad Pf 07/06/2016, 02/05/2018   Influenza Split 02/16/2017   Influenza Whole 04/25/2005, 03/30/2006, 04/06/2006, 04/10/2008, 02/27/2009   Influenza,inj,Quad PF,6+ Mos 01/09/2019   Influenza-Unspecified 02/24/2010, 03/01/2021, 04/28/2022   Moderna Covid-19 Vaccine Bivalent Booster 11yrs & up 06/07/2021   PFIZER Comirnaty(Gray Top)Covid-19 Tri-Sucrose Vaccine 12/11/2020   PFIZER(Purple Top)SARS-COV-2 Vaccination 08/22/2019, 09/14/2019, 01/15/2020, 04/28/2022   PPD Test 06/14/2017   Pneumococcal Polysaccharide-23 03/30/2005, 04/25/2005   Td 09/27/2005, 10/25/2005   Tdap 12/11/2020     7.  Glucose/Cholesterol:  A1C up to 5.9%.  Cholesterol abnormal with very low HDL at 26 and triglycerides high, though improved to 295.   Lipid Panel     Component Value Date/Time   CHOL 143 02/03/2023 0914   TRIG 295 (H) 02/03/2023 0914   HDL 26 (L) 02/03/2023 0914   CHOLHDL 7.0 (H) 05/25/2020 0938   CHOLHDL 4.2  Ratio 05/10/2010 7871   VLDL 43 (H) 05/10/2010 2128   LDLCALC 69 02/03/2023 0914   LABVLDL 48 (H) 02/03/2023 0914     8.  Other:  Norton:  She has recently established with a new rheumatologist, H[facility] at [facility].  Pt lost to follow up with Rheum and was developing disfigurement of fingers and rheumatoid nodules.    Current Meds  Medication Sig   adalimumab (HUMIRA) 40 MG/0.8ML injection Inject 40 mg into the skin every 14 (fourteen) days. On Tuesdays   fexofenadine  (ALLEGRA ) 180 MG tablet Take 1 tablet (180 mg total) by mouth daily.   fluticasone  (FLONASE ) 50 MCG/ACT nasal spray Place 2 sprays into both nostrils daily.   leflunomide (ARAVA) 10 MG tablet Take 1 tablet by mouth daily.   montelukast  (SINGULAIR ) 10 MG tablet TAKE ONE TABLET BY MOUTH DAILY   phenazopyridine  (PYRIDIUM ) 200 MG tablet Take 1 tablet (200 mg total) by mouth 3 (three) times daily as needed for pain.   predniSONE (DELTASONE) 5 MG tablet Take 5 mg by mouth. Take 3 tablets (15 mg total) by mouth daily for 14 days, THEN 2 tablets (10 mg total) daily for 14 days, THEN 1 tablet (5 mg total) daily for 14 days.   No Known Allergies  Past Medical History:  Diagnosis Date   Chronic kidney disease    Haley Norton:  related to Norton   Dyslipidemia    GERD (gastroesophageal reflux disease)  Prediabetes    Rheumatoid arthritis (HCC) 1997   Renal involvement as well.  Followed by Haley [facility], [facility]   Past Surgical History:  Procedure Laterality Date   CESAREAN SECTION  [id number]   2 previous c sections   TUBAL LIGATION  2006   Family History  Problem Relation Age of Onset   Stroke Mother        Just never recovered from stroke long ago.   Heart disease Mother    Hyperlipidemia Father    Hypertension Father    Diabetes Father    Pancreatitis Son        From high triglycerides   Obesity Son        has lost 30 lbs since pancreatitis.   Breast cancer Neg Hx    Social  History   Socioeconomic History   Marital status: Married    Spouse name: Haley Norton   Number of children: 2   Years of education: 12   Highest education level: 12th grade  Occupational History   Occupation: Housewife  Tobacco Use   Smoking status: Never    Passive exposure: Never   Smokeless tobacco: Never  Vaping Use   Vaping status: Never Used  Substance and Sexual Activity   Alcohol use: No   Drug use: No   Sexual activity: Yes    Birth control/protection: Surgical  Other Topics Concern   Not on file  Social History Narrative   Lives at home with husband and daughter, who is total care with Haley Norton   Does not have a place to really get out an walk easily in neighborhood.     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   Social Determinants of Health   Norton Resource Strain: Low Risk  (02/07/2023)   Overall Norton Resource Strain (CARDIA)    Difficulty of Paying Living Expenses: Not hard at all  Food Insecurity: No Food Insecurity (02/07/2023)   Hunger Vital Sign    Worried About Running Out of Food in the Last Year: Never true    Ran Out of Food in the Last Year: Never true  Transportation Needs: No Transportation Needs (02/07/2023)   PRAPARE - Administrator, Civil Service (Medical): No    Lack of Transportation (Non-Medical): No  Physical Activity: Unknown (06/07/2018)   Exercise Vital Sign    Days of Exercise per Week: 7 days    Minutes of Exercise per Session: Not on file  Stress: No Stress Concern Present (05/12/2017)   [facility] of Occupational Health - Occupational Stress Questionnaire    Feeling of Stress : Not at all  Social Connections: Moderately Integrated (05/12/2017)   Social Connection and Isolation Panel [NHANES]    Frequency of Communication with Friends and Family: More than three times a week    Frequency of Social Gatherings with Friends and Family: More than three times a week    Attends Religious Services: More than  4 times per year    Active Member of Haley Norton or Organizations: No    Attends Banker Meetings: Never    Marital Status: Married  Catering manager Violence: Not At Risk (02/07/2023)   Humiliation, Afraid, Rape, and Kick questionnaire    Fear of Current or Ex-Partner: No    Emotionally Abused: No    Physically Abused: No    Sexually Abused: No      Review of Systems  HENT:  Negative for dental problem (Had dental  cleaning yesterday.  Has future appts for filling cavities.  Dental Clinic.).   Respiratory:  Negative for shortness of breath.   Cardiovascular:  Negative for chest pain, palpitations and leg swelling.  Gastrointestinal:  Negative for blood in stool (No melena).  Musculoskeletal:  Positive for arthralgias.  Psychiatric/Behavioral:  Negative for dysphoric mood. The patient is not nervous/anxious.       Objective:   BP 130/78 (BP Location: Left Arm, Patient Position: Sitting, Cuff Size: Normal)   Pulse 78   Resp 16   Ht 5' 2.5 (1.588 m)   Wt 177 lb (80.3 kg)   LMP  (LMP Unknown) Comment: No period since September 2019  BMI 31.86 kg/m   Physical Exam Constitutional:      Appearance: She is obese.  HENT:     Head: Normocephalic and atraumatic.     Right Ear: Tympanic membrane, ear canal and external ear normal.     Left Ear: Tympanic membrane, ear canal and external ear normal.     Nose: Nose normal.     Mouth/Throat:     Mouth: Mucous membranes are moist.     Pharynx: Oropharynx is clear.  Eyes:     Extraocular Movements: Extraocular movements intact.     Conjunctiva/sclera: Conjunctivae normal.     Pupils: Pupils are equal, round, and reactive to light.     Comments: Discs sharp  Neck:     Thyroid: No thyroid mass or thyromegaly.  Cardiovascular:     Rate and Rhythm: Normal rate and regular rhythm.     Heart sounds: S1 normal and S2 normal. No murmur heard.    No friction rub. No S3 or S4 sounds.     Comments: No carotid bruits.  Carotid,  radial, femoral, DP and PT pulses normal and equal.   Pulmonary:     Effort: Pulmonary effort is normal.     Breath sounds: Normal breath sounds and air entry.  Chest:  Breasts:    Right: No inverted nipple, mass or nipple discharge.     Left: No inverted nipple, mass or nipple discharge.  Abdominal:     General: Bowel sounds are normal.     Palpations: Abdomen is soft. There is no hepatomegaly, splenomegaly or mass.     Tenderness: There is no abdominal tenderness.     Hernia: No hernia is present.  Genitourinary:    Comments: Normal external female genitalia No uterine or adnexal mass or tenderness. Musculoskeletal:        General: Normal range of motion.     Cervical back: Normal range of motion and neck supple.     Right lower leg: No edema.     Left lower leg: No edema.     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  Lymphadenopathy:     Head:     Right side of head: No submental or submandibular adenopathy.     Left side of head: No submental or submandibular adenopathy.     Cervical: No cervical adenopathy.     Upper Body:     Right upper body: No supraclavicular or axillary adenopathy.     Left upper body: No supraclavicular or axillary adenopathy.     Lower Body: No right inguinal adenopathy. No left inguinal adenopathy.  Skin:    General: Skin is warm.     Findings: Lesion (2 cm nodule on extensor surface just below left elbow.  Smaller lesion at extensor surface of right elbow.  Small nodule overlying 5th PIP joint and one other finger joint.  She has) present.  Neurological:     Mental Status: She is alert.     Cranial Nerves: Cranial nerves 2-12 are intact.     Sensory: Sensation is intact.     Motor: Motor function is intact.     Coordination: Coordination is intact.     Gait: Gait is intact.     Deep Tendon Reflexes: Reflexes are normal and symmetric.      Assessment & Plan   CPE without pap Next mammogram  10/2023 Influenza vaccine Hep B vaccine #1/3 as had serology with [facility] that showed not immune:  negative SAb and Core Ab Hopefully, can get screening colonoscopy with [facility] Return FIT in 2 weeks.  2.  Norton:  as per [facility]  3.  Vitamin D  deficiency with [facility]:  supplementing  4.  Prediabetes/dyslipidemia:  continue to work on diet and recommend water exercise or recumbent bike for exercise to spare joints.

## 2023-02-21 ENCOUNTER — Ambulatory Visit: Payer: Self-pay | Admitting: Internal Medicine

## 2023-02-21 DIAGNOSIS — Z23 Encounter for immunization: Secondary | ICD-10-CM

## 2023-02-21 DIAGNOSIS — M0579 Rheumatoid arthritis with rheumatoid factor of multiple sites without organ or systems involvement: Secondary | ICD-10-CM

## 2023-02-21 DIAGNOSIS — Z1211 Encounter for screening for malignant neoplasm of colon: Secondary | ICD-10-CM

## 2023-02-21 LAB — POC FIT TEST STOOL: Fecal Occult Blood: NEGATIVE

## 2023-02-21 NOTE — Addendum Note (Signed)
Addended by: Duayne Cal on: 02/21/2023 04:48 PM   Modules accepted: Orders

## 2023-02-22 LAB — CBC WITH DIFFERENTIAL/PLATELET
Basophils Absolute: 0.1 10*3/uL (ref 0.0–0.2)
Basos: 1 %
EOS (ABSOLUTE): 0.2 10*3/uL (ref 0.0–0.4)
Eos: 3 %
Hematocrit: 39.8 % (ref 34.0–46.6)
Hemoglobin: 12.6 g/dL (ref 11.1–15.9)
Immature Grans (Abs): 0 10*3/uL (ref 0.0–0.1)
Immature Granulocytes: 0 %
Lymphocytes Absolute: 4.8 10*3/uL — ABNORMAL HIGH (ref 0.7–3.1)
Lymphs: 59 %
MCH: 28.6 pg (ref 26.6–33.0)
MCHC: 31.7 g/dL (ref 31.5–35.7)
MCV: 91 fL (ref 79–97)
Monocytes Absolute: 1 10*3/uL — ABNORMAL HIGH (ref 0.1–0.9)
Monocytes: 13 %
Neutrophils Absolute: 1.9 10*3/uL (ref 1.4–7.0)
Neutrophils: 24 %
Platelets: 273 10*3/uL (ref 150–450)
RBC: 4.4 x10E6/uL (ref 3.77–5.28)
RDW: 14.2 % (ref 11.7–15.4)
WBC: 8 10*3/uL (ref 3.4–10.8)

## 2023-02-22 LAB — COMPREHENSIVE METABOLIC PANEL
ALT: 21 IU/L (ref 0–32)
AST: 21 IU/L (ref 0–40)
Albumin: 4 g/dL (ref 3.8–4.9)
Alkaline Phosphatase: 61 IU/L (ref 44–121)
BUN/Creatinine Ratio: 20 (ref 9–23)
BUN: 15 mg/dL (ref 6–24)
Bilirubin Total: 0.4 mg/dL (ref 0.0–1.2)
CO2: 22 mmol/L (ref 20–29)
Calcium: 9 mg/dL (ref 8.7–10.2)
Chloride: 103 mmol/L (ref 96–106)
Creatinine, Ser: 0.76 mg/dL (ref 0.57–1.00)
Globulin, Total: 3.1 g/dL (ref 1.5–4.5)
Glucose: 83 mg/dL (ref 70–99)
Potassium: 3.7 mmol/L (ref 3.5–5.2)
Sodium: 139 mmol/L (ref 134–144)
Total Protein: 7.1 g/dL (ref 6.0–8.5)
eGFR: 95 mL/min/{1.73_m2} (ref >59)

## 2023-04-03 ENCOUNTER — Other Ambulatory Visit: Payer: Self-pay

## 2023-04-03 DIAGNOSIS — Z79899 Other long term (current) drug therapy: Secondary | ICD-10-CM

## 2023-04-03 DIAGNOSIS — M0579 Rheumatoid arthritis with rheumatoid factor of multiple sites without organ or systems involvement: Secondary | ICD-10-CM

## 2023-04-04 LAB — CBC WITH DIFFERENTIAL/PLATELET
Basophils Absolute: 0 10*3/uL (ref 0.0–0.2)
Basos: 1 %
EOS (ABSOLUTE): 0.1 10*3/uL (ref 0.0–0.4)
Eos: 3 %
Hematocrit: 40.2 % (ref 34.0–46.6)
Hemoglobin: 13 g/dL (ref 11.1–15.9)
Immature Grans (Abs): 0 10*3/uL (ref 0.0–0.1)
Immature Granulocytes: 0 %
Lymphocytes Absolute: 2.3 10*3/uL (ref 0.7–3.1)
Lymphs: 52 %
MCH: 29 pg (ref 26.6–33.0)
MCHC: 32.3 g/dL (ref 31.5–35.7)
MCV: 90 fL (ref 79–97)
Monocytes Absolute: 0.6 10*3/uL (ref 0.1–0.9)
Monocytes: 12 %
Neutrophils Absolute: 1.4 10*3/uL (ref 1.4–7.0)
Neutrophils: 32 %
Platelets: 257 10*3/uL (ref 150–450)
RBC: 4.49 x10E6/uL (ref 3.77–5.28)
RDW: 14 % (ref 11.7–15.4)
WBC: 4.5 10*3/uL (ref 3.4–10.8)

## 2023-04-04 LAB — COMPREHENSIVE METABOLIC PANEL
ALT: 16 [IU]/L (ref 0–32)
AST: 22 [IU]/L (ref 0–40)
Albumin: 4 g/dL (ref 3.8–4.9)
Alkaline Phosphatase: 67 [IU]/L (ref 44–121)
BUN/Creatinine Ratio: 11 (ref 9–23)
BUN: 10 mg/dL (ref 6–24)
Bilirubin Total: 0.4 mg/dL (ref 0.0–1.2)
CO2: 23 mmol/L (ref 20–29)
Calcium: 8.9 mg/dL (ref 8.7–10.2)
Chloride: 106 mmol/L (ref 96–106)
Creatinine, Ser: 0.87 mg/dL (ref 0.57–1.00)
Globulin, Total: 3 g/dL (ref 1.5–4.5)
Glucose: 98 mg/dL (ref 70–99)
Potassium: 4.2 mmol/L (ref 3.5–5.2)
Sodium: 141 mmol/L (ref 134–144)
Total Protein: 7 g/dL (ref 6.0–8.5)
eGFR: 81 mL/min/{1.73_m2} (ref >59)

## 2023-04-25 ENCOUNTER — Other Ambulatory Visit: Payer: Self-pay

## 2023-04-25 DIAGNOSIS — Z79899 Other long term (current) drug therapy: Secondary | ICD-10-CM

## 2023-04-26 LAB — CBC WITH DIFFERENTIAL/PLATELET
Basophils Absolute: 0 10*3/uL (ref 0.0–0.2)
Basos: 1 %
EOS (ABSOLUTE): 0.3 10*3/uL (ref 0.0–0.4)
Eos: 5 %
Hematocrit: 39.3 % (ref 34.0–46.6)
Hemoglobin: 12.9 g/dL (ref 11.1–15.9)
Immature Grans (Abs): 0 10*3/uL (ref 0.0–0.1)
Immature Granulocytes: 0 %
Lymphocytes Absolute: 2.7 10*3/uL (ref 0.7–3.1)
Lymphs: 46 %
MCH: 29.4 pg (ref 26.6–33.0)
MCHC: 32.8 g/dL (ref 31.5–35.7)
MCV: 90 fL (ref 79–97)
Monocytes Absolute: 0.7 10*3/uL (ref 0.1–0.9)
Monocytes: 12 %
Neutrophils Absolute: 2 10*3/uL (ref 1.4–7.0)
Neutrophils: 36 %
Platelets: 273 10*3/uL (ref 150–450)
RBC: 4.39 x10E6/uL (ref 3.77–5.28)
RDW: 14 % (ref 11.7–15.4)
WBC: 5.7 10*3/uL (ref 3.4–10.8)

## 2023-05-01 ENCOUNTER — Telehealth: Payer: Self-pay

## 2023-05-01 DIAGNOSIS — R3129 Other microscopic hematuria: Secondary | ICD-10-CM

## 2023-05-01 NOTE — Telephone Encounter (Signed)
Patient called to report that she was unable to follow up with [facility]. Patient was told she needs a new referral to return for yearly check ups.

## 2023-06-02 ENCOUNTER — Other Ambulatory Visit (INDEPENDENT_AMBULATORY_CARE_PROVIDER_SITE_OTHER): Payer: Self-pay

## 2023-06-02 DIAGNOSIS — R350 Frequency of micturition: Secondary | ICD-10-CM

## 2023-06-02 LAB — POCT URINALYSIS DIPSTICK
Bilirubin, UA: NEGATIVE
Glucose, UA: NEGATIVE
Ketones, UA: NEGATIVE
Leukocytes, UA: NEGATIVE
Nitrite, UA: NEGATIVE
Protein, UA: NEGATIVE
Spec Grav, UA: 1.015 (ref 1.010–1.025)
Urobilinogen, UA: 0.2 U/dL
pH, UA: 6 (ref 5.0–8.0)

## 2023-06-02 MED ORDER — NITROFURANTOIN MONOHYD MACRO 100 MG PO CAPS: 100.0000 mg | ORAL_CAPSULE | Freq: Two times a day (BID) | ORAL | 0 refills | Status: AC

## 2023-06-02 NOTE — Progress Notes (Signed)
 Burning sensation on her bladder, vaginal burning, Lower back pain, some burning during urination and urinary frequency. Has taken AZO this past Wednesday which helped some. Denies vaginal discharge.  Sent MACROBID  100mg  twice daily for 3 days. Patient has been notified.

## 2023-06-04 LAB — URINE CULTURE

## 2023-06-05 NOTE — Telephone Encounter (Signed)
 Referral was sent.

## 2023-07-26 ENCOUNTER — Other Ambulatory Visit: Payer: Self-pay

## 2023-07-26 DIAGNOSIS — M0579 Rheumatoid arthritis with rheumatoid factor of multiple sites without organ or systems involvement: Secondary | ICD-10-CM

## 2023-07-27 LAB — COMPREHENSIVE METABOLIC PANEL
ALT: 12 [IU]/L (ref 0–32)
AST: 16 [IU]/L (ref 0–40)
Albumin: 4.2 g/dL (ref 3.8–4.9)
Alkaline Phosphatase: 66 [IU]/L (ref 44–121)
BUN/Creatinine Ratio: 14 (ref 9–23)
BUN: 10 mg/dL (ref 6–24)
Bilirubin Total: 0.3 mg/dL (ref 0.0–1.2)
CO2: 21 mmol/L (ref 20–29)
Calcium: 9 mg/dL (ref 8.7–10.2)
Chloride: 104 mmol/L (ref 96–106)
Creatinine, Ser: 0.71 mg/dL (ref 0.57–1.00)
Globulin, Total: 2.9 g/dL (ref 1.5–4.5)
Glucose: 93 mg/dL (ref 70–99)
Potassium: 4.2 mmol/L (ref 3.5–5.2)
Sodium: 141 mmol/L (ref 134–144)
Total Protein: 7.1 g/dL (ref 6.0–8.5)
eGFR: 103 mL/min/{1.73_m2} (ref >59)

## 2023-07-27 LAB — CBC WITH DIFFERENTIAL/PLATELET
Basophils Absolute: 0 10*3/uL (ref 0.0–0.2)
Basos: 1 %
EOS (ABSOLUTE): 0.2 10*3/uL (ref 0.0–0.4)
Eos: 4 %
Hematocrit: 39.9 % (ref 34.0–46.6)
Hemoglobin: 13.2 g/dL (ref 11.1–15.9)
Immature Grans (Abs): 0 10*3/uL (ref 0.0–0.1)
Immature Granulocytes: 0 %
Lymphocytes Absolute: 2.4 10*3/uL (ref 0.7–3.1)
Lymphs: 41 %
MCH: 30.3 pg (ref 26.6–33.0)
MCHC: 33.1 g/dL (ref 31.5–35.7)
MCV: 92 fL (ref 79–97)
Monocytes Absolute: 0.7 10*3/uL (ref 0.1–0.9)
Monocytes: 12 %
Neutrophils Absolute: 2.5 10*3/uL (ref 1.4–7.0)
Neutrophils: 42 %
Platelets: 360 10*3/uL (ref 150–450)
RBC: 4.35 x10E6/uL (ref 3.77–5.28)
RDW: 13.1 % (ref 11.7–15.4)
WBC: 5.9 10*3/uL (ref 3.4–10.8)

## 2023-08-07 ENCOUNTER — Other Ambulatory Visit: Payer: Self-pay

## 2023-08-07 DIAGNOSIS — Z125 Encounter for screening for malignant neoplasm of prostate: Secondary | ICD-10-CM

## 2023-08-07 DIAGNOSIS — R7303 Prediabetes: Secondary | ICD-10-CM

## 2023-08-07 DIAGNOSIS — E785 Hyperlipidemia, unspecified: Secondary | ICD-10-CM

## 2023-08-07 NOTE — Addendum Note (Signed)
 Addended by: Jayli Fogleman, [place] N on: 08/07/2023 09:06 AM   Modules accepted: Orders

## 2023-08-07 NOTE — Addendum Note (Signed)
 Addended by: Torii Royse, [place] N on: 08/07/2023 10:45 AM   Modules accepted: Orders

## 2023-08-08 LAB — LIPID PANEL W/O CHOL/HDL RATIO
Cholesterol, Total: 188 mg/dL (ref 100–199)
HDL: 25 mg/dL — ABNORMAL LOW (ref >39)
LDL Chol Calc (NIH): 72 mg/dL (ref 0–99)
Triglycerides: 582 mg/dL (ref 0–149)
VLDL Cholesterol Cal: 91 mg/dL — ABNORMAL HIGH (ref 5–40)

## 2023-08-08 LAB — HEMOGLOBIN A1C
Est. average glucose Bld gHb Est-mCnc: 117 mg/dL
Hgb A1c MFr Bld: 5.7 % — ABNORMAL HIGH (ref 4.8–5.6)

## 2023-08-10 ENCOUNTER — Encounter: Payer: Self-pay | Admitting: Internal Medicine

## 2023-08-10 ENCOUNTER — Ambulatory Visit: Payer: Self-pay | Admitting: Internal Medicine

## 2023-08-10 VITALS — BP 130/80 | HR 77 | Resp 16 | Ht 62.5 in | Wt 169.0 lb

## 2023-08-10 DIAGNOSIS — Z23 Encounter for immunization: Secondary | ICD-10-CM

## 2023-08-10 DIAGNOSIS — M0579 Rheumatoid arthritis with rheumatoid factor of multiple sites without organ or systems involvement: Secondary | ICD-10-CM

## 2023-08-10 DIAGNOSIS — R052 Subacute cough: Secondary | ICD-10-CM

## 2023-08-10 DIAGNOSIS — R7303 Prediabetes: Secondary | ICD-10-CM

## 2023-08-10 DIAGNOSIS — E785 Hyperlipidemia, unspecified: Secondary | ICD-10-CM

## 2023-08-10 DIAGNOSIS — N059 Unspecified nephritic syndrome with unspecified morphologic changes: Secondary | ICD-10-CM

## 2023-08-10 DIAGNOSIS — N189 Chronic kidney disease, unspecified: Secondary | ICD-10-CM | POA: Insufficient documentation

## 2023-08-10 DIAGNOSIS — L659 Nonscarring hair loss, unspecified: Secondary | ICD-10-CM

## 2023-08-10 NOTE — Patient Instructions (Signed)
 Talk with Dr. Blase Mess about concern for hair loss increasing with Arava and whether Derm who specializes in hair loss can provide any remedy

## 2023-08-10 NOTE — Progress Notes (Signed)
 Subjective:    Patient ID: Haley Norton, female   DOB: [birth year], 53 y.o.   MRN: [id number]   HPI  Duayne Cal interprets   Dry Cough:  Ill about a month ago with congestion, cough, muscle aches and fever.  Improved after about 2 weeks, but paroxysmal dry cough continues, mainly at night and throat is dry.  Feels this is every night after lies down.  Feels like itchiness as if she were breathing in dust.  Using Flonase regularly.  Not using Allegra regularly--was using for about a month with a bit of improvement. No sneezing, itchy watery nose or eyes.   Ears itch at times.   Cleans floor in bedroom daily.   2.  Dyslipidemia: HDL low and trigs much higher above 500 now.  She ran out of fish oil 2 months ago as [facility] close.  She has not looked online for her fish oil brand.  She has a stationary bike and walking.  Total of 60 minutes exercise daily.  Eating fish, avocados, nuts, olives.   Lipid Panel     Component Value Date/Time   CHOL 188 08/07/2023 0924   TRIG 582 (HH) 08/07/2023 0924   HDL 25 (L) 08/07/2023 0924   CHOLHDL 7.0 (H) 05/25/2020 0938   CHOLHDL 4.2 Ratio 05/10/2010 2128   VLDL 43 (H) 05/10/2010 2128   LDLCALC 72 08/07/2023 0924   LABVLDL 91 (H) 08/07/2023 0924   3.  Prediabetes:   A1C  down to 5.7%.  Previously 5.9%.  See diet and physical activity above.    4.  RA:  hand symptoms/signs improved with addition of Arava to Humira last September.    CBC, CMP remain stable. Was given course of prednisone in December.  Her Humira Has not been to renal in 2 years.  Previously followed by Dr. 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  Brings up more hair loss, possibly more significant since starting Arava.  Losing from all over head.  We have checked thyroid studies in past with normal results.  Current Meds  Medication Sig   adalimumab (HUMIRA) 40 MG/0.8ML injection Inject 40 mg into the skin every 7 (seven) days. On Tuesdays    fexofenadine (ALLEGRA) 180 MG tablet Take 1 tablet (180 mg total) by mouth daily.   fluticasone (FLONASE) 50 MCG/ACT nasal spray Place 2 sprays into both nostrils daily.   leflunomide (ARAVA) 10 MG tablet Take 1 tablet by mouth daily.   montelukast (SINGULAIR) 10 MG tablet TAKE ONE TABLET BY MOUTH DAILY    No Known Allergies   Review of Systems    Objective:   BP 130/80 (BP Location: Right Arm, Patient Position: Sitting, Cuff Size: Normal)   Pulse 77   Resp 16   Ht 5' 2.5" (1.588 m)   Wt 169 lb (76.7 kg)   LMP  (LMP Unknown) Comment: No period since September 2019  SpO2 97%   BMI 30.42 kg/m   Physical Exam NAD HEENT:  Thick hair, but definitely thinning on scalp.  NT over frontal and maxillary areas.  PERRL, EOMI, conjunctivae without injection.  TMs pearly gray,  nasal mucosa swollen with clear to white nasal discharge.  Throat with mild injection, no exudate.   Neck:  Supple, No adenopathy Chest:  CTA CV:  RRR without murmur or rub.  Radial and DP pulses normal and equal Abd:  S, NT, No HSM or mass, + BS. LE:  No edema.   Assessment & Plan  Cough:  Likely resolving viral syndrome or addition of allergy symptoms.  Neti pot daily followed by Flonase and to try Loratadine 10 mg daily.  If no improvement may try zyrtec or Xyzal or clarinex.    2.  Prediabetes:  improved  3.  Dyslipidemia:  encouraged her to get back on brand of fish oil she was using previously--check online.  She has bottle at home.  Describes a good diet and regular physical activity.  4.  RA:  improved control.    5.  CKD/nephritis:  referral back to [facility].  Previously seen by Dr. Hyman Hopes, who is no longer there.  6.  Hair loss:  a chronic issue that is worsening, possibly since addition of Arava.  She will discuss with Dr. Blase Mess and if anything can be done.  Weigh improvement with RA with hair loss.

## 2023-09-09 LAB — LAB REPORT - SCANNED
Albumin, Urine POC: 18.1
Albumin/Creatinine Ratio, Urine, POC: 8
Creatinine, POC: 238.6 mg/dL

## 2023-12-08 ENCOUNTER — Other Ambulatory Visit: Payer: Self-pay | Admitting: Internal Medicine

## 2023-12-08 ENCOUNTER — Telehealth: Payer: Self-pay | Admitting: Internal Medicine

## 2023-12-08 DIAGNOSIS — R300 Dysuria: Secondary | ICD-10-CM

## 2023-12-08 LAB — POCT URINALYSIS DIPSTICK: Leukocytes, UA: NEGATIVE

## 2023-12-08 MED ORDER — NITROFURANTOIN MONOHYD MACRO 100 MG PO CAPS: ORAL_CAPSULE | ORAL | 0 refills | Status: DC

## 2023-12-08 MED ORDER — PHENAZOPYRIDINE HCL 100 MG PO TABS: 100.0000 mg | ORAL_TABLET | Freq: Three times a day (TID) | ORAL | 0 refills | Status: DC | PRN

## 2023-12-08 NOTE — Telephone Encounter (Signed)
 Patient has been seen.

## 2023-12-08 NOTE — Progress Notes (Signed)
 Dysuria with suprapubic discomfort for 3 days with some low back pain. No fever. + nausea Used Azo yesterday and urine dark orange.

## 2023-12-11 ENCOUNTER — Telehealth: Payer: Self-pay | Admitting: Internal Medicine

## 2023-12-11 LAB — SPECIMEN STATUS REPORT

## 2023-12-12 LAB — SPECIMEN STATUS REPORT

## 2023-12-12 LAB — URINE CULTURE

## 2023-12-13 NOTE — Telephone Encounter (Signed)
 See if she is doing better and let her know the lab did not run the culture so we have to go by her symptoms

## 2023-12-27 NOTE — Telephone Encounter (Signed)
 Called patient, patient states she is feeling good now , patient reports she does not have symptoms any more.

## 2024-02-07 ENCOUNTER — Encounter: Payer: Self-pay | Admitting: Internal Medicine

## 2024-02-07 ENCOUNTER — Ambulatory Visit (INDEPENDENT_AMBULATORY_CARE_PROVIDER_SITE_OTHER): Payer: Self-pay | Admitting: Internal Medicine

## 2024-02-07 VITALS — BP 140/90 | HR 75 | Resp 17 | Ht 62.0 in | Wt 174.0 lb

## 2024-02-07 DIAGNOSIS — E559 Vitamin D deficiency, unspecified: Secondary | ICD-10-CM | POA: Insufficient documentation

## 2024-02-07 DIAGNOSIS — H547 Unspecified visual loss: Secondary | ICD-10-CM | POA: Insufficient documentation

## 2024-02-07 DIAGNOSIS — J3089 Other allergic rhinitis: Secondary | ICD-10-CM | POA: Insufficient documentation

## 2024-02-07 DIAGNOSIS — R519 Headache, unspecified: Secondary | ICD-10-CM

## 2024-02-07 DIAGNOSIS — M0579 Rheumatoid arthritis with rheumatoid factor of multiple sites without organ or systems involvement: Secondary | ICD-10-CM

## 2024-02-07 DIAGNOSIS — Z0000 Encounter for general adult medical examination without abnormal findings: Secondary | ICD-10-CM

## 2024-02-07 DIAGNOSIS — R7303 Prediabetes: Secondary | ICD-10-CM

## 2024-02-07 DIAGNOSIS — Z5948 Other specified lack of adequate food: Secondary | ICD-10-CM | POA: Insufficient documentation

## 2024-02-07 DIAGNOSIS — Z23 Encounter for immunization: Secondary | ICD-10-CM

## 2024-02-07 DIAGNOSIS — Z1231 Encounter for screening mammogram for malignant neoplasm of breast: Secondary | ICD-10-CM

## 2024-02-07 DIAGNOSIS — E785 Hyperlipidemia, unspecified: Secondary | ICD-10-CM

## 2024-02-07 MED ORDER — FEXOFENADINE HCL 180 MG PO TABS: ORAL_TABLET | ORAL | Status: AC

## 2024-02-07 MED ORDER — FLUTICASONE PROPIONATE 50 MCG/ACT NA SUSP: 2.0000 | Freq: Every day | NASAL | Status: AC

## 2024-02-07 MED ORDER — MONTELUKAST SODIUM 10 MG PO TABS: 10.0000 mg | ORAL_TABLET | Freq: Every day | ORAL | 11 refills | Status: AC

## 2024-02-07 MED ORDER — FENOFIBRATE 145 MG PO TABS: 145.0000 mg | ORAL_TABLET | Freq: Every day | ORAL | 11 refills | Status: AC

## 2024-02-07 MED ORDER — VITAMIN D (ERGOCALCIFEROL) 1.25 MG (50000 UNIT) PO CAPS: 50000.0000 [IU] | ORAL_CAPSULE | ORAL | 3 refills | Status: AC

## 2024-02-07 MED ORDER — CALCIUM CITRATE 250 MG PO TABS: ORAL_TABLET | ORAL | Status: AC

## 2024-02-07 MED ORDER — CYCLOBENZAPRINE HCL 10 MG PO TABS: ORAL_TABLET | ORAL | 0 refills | Status: AC

## 2024-02-07 NOTE — Progress Notes (Signed)
 Subjective:    Patient ID: Haley Norton, female   DOB: [birth year], 53 y.o.   MRN: [id number]   HPI  CPE without pap  1.  Pap:  Last in 2023 and normal.  Always normal.    2.  Mammogram:  Did not have her mammogram yet.  Usually has in June.  No family history of breast cancer.    3.  Osteoprevention:.  Her Vitamin D  level in June was just above 13, but she thought she was told her level was fine and could stop the supplementation.  She does get outside daily for 20 minutes.  She has a stationary bike and also walks 20 minutes 3 to 4 times weekly.  Not taking calcium .  Drinks milk once daily.    4.  Guaiac Cards/FIT:  Last checked 01/2023 and negative for blood.    5.  Colonoscopy:  Never.  No family history of colon cancer.    6.  Immunizations:   Immunization History  Administered Date(s) Administered   H1N1 05/27/2008   Hepatitis B, ADULT 02/07/2023   Influenza Inj Mdck Quad Pf 07/06/2016, 02/05/2018   Influenza Split 02/24/2010, 02/16/2017   Influenza Whole 04/25/2005, 03/30/2006, 04/06/2006, 04/10/2008, 02/27/2009   Influenza, Mdck, Trivalent,PF 6+ MOS(egg free) 02/07/2023   Influenza,inj,Quad PF,6+ Mos 01/09/2019   Influenza-Unspecified 02/24/2010, 03/01/2021, 04/28/2022   Moderna Covid-19 Vaccine Bivalent Booster 11yrs & up 06/07/2021   PFIZER Comirnaty(Gray Top)Covid-19 Tri-Sucrose Vaccine 12/11/2020   PFIZER(Purple Top)SARS-COV-2 Vaccination 08/22/2019, 09/14/2019, 01/15/2020, 04/28/2022   PNEUMOCOCCAL CONJUGATE-20 02/01/2023   PPD Test 06/14/2017   Pfizer(Comirnaty)Fall Seasonal Vaccine 12 years and older 11/15/2023   Pneumococcal Polysaccharide-23 03/30/2005, 04/25/2005   Td 09/27/2005, 10/25/2005   Tdap 12/11/2020   Zoster Recombinant(Shingrix ) 02/21/2023, 08/10/2023     7.  Glucose/Cholesterol:  Prediabetes improved with A1C at 5.7%.  Triglycerides much higher with stopping fish oil for GERD symptoms, which were minimally improved.    Lipid  Panel     Component Value Date/Time   CHOL 188 08/07/2023 0924   TRIG 582 (HH) 08/07/2023 0924   HDL 25 (L) 08/07/2023 0924   CHOLHDL 7.0 (H) 05/25/2020 0938   CHOLHDL 4.2 Ratio 05/10/2010 2128   VLDL 43 (H) 05/10/2010 2128   LDLCALC 72 08/07/2023 0924   LABVLDL 91 (H) 08/07/2023 0924     Current Meds  Medication Sig   adalimumab (HUMIRA) 40 MG/0.8ML injection Inject 40 mg into the skin every 7 (seven) days. On Tuesdays   fexofenadine  (ALLEGRA ) 180 MG tablet Take 1 tablet (180 mg total) by mouth daily.   fluticasone  (FLONASE ) 50 MCG/ACT nasal spray Place 2 sprays into both nostrils daily.   leflunomide (ARAVA) 10 MG tablet Take 1 tablet by mouth daily.   montelukast  (SINGULAIR ) 10 MG tablet TAKE ONE TABLET BY MOUTH DAILY   No Known Allergies  Past Medical History:  Diagnosis Date   Chronic kidney disease    Dr. Douglass:  related to RA   Dyslipidemia    GERD (gastroesophageal reflux disease)    Prediabetes    Rheumatoid arthritis (HCC) 1997   Renal involvement as well.  Followed by [facility], Dr. Douglass, Nephrology, and [facility]   Past Surgical History:  Procedure Laterality Date   CESAREAN SECTION  [phone]   2 previous c sections   TUBAL LIGATION  2006   Family History  Problem Relation Age of Onset   Stroke Mother        Just never recovered from stroke long ago.  Heart disease Mother    Hyperlipidemia Father    Hypertension Father    Diabetes Father    Pancreatitis Son        From high triglycerides   Obesity Son        has lost 30 lbs since pancreatitis.   Breast cancer Neg Hx    Social History   Socioeconomic History   Marital status: Married    Spouse name: Leafy   Number of children: 2   Years of education: 12   Highest education level: 12th grade  Occupational History   Occupation: Housewife  Tobacco Use   Smoking status: Never    Passive exposure: Never   Smokeless tobacco: Never  Vaping Use   Vaping status: Never Used   Substance and Sexual Activity   Alcohol use: No   Drug use: No   Sexual activity: Yes    Birth control/protection: Surgical  Other Topics Concern   Not on file  Social History Narrative   Lives at home with husband and daughter, who is total care with Johnsie Finder Syndrome   Does not have a place to really get out an walk easily in neighborhood.     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   Social Drivers of Health   Financial Resource Strain: Medium Risk (02/07/2024)   Overall Financial Resource Strain (CARDIA)    Difficulty of Paying Living Expenses: Somewhat hard  Food Insecurity: Food Insecurity Present (02/07/2024)   Hunger Vital Sign    Worried About Running Out of Food in the Last Year: Sometimes true    Ran Out of Food in the Last Year: Sometimes true  Transportation Needs: No Transportation Needs (02/07/2024)   PRAPARE - Administrator, Civil Service (Medical): No    Lack of Transportation (Non-Medical): No  Physical Activity: Unknown (06/07/2018)   Exercise Vital Sign    Days of Exercise per Week: 7 days    Minutes of Exercise per Session: Not on file  Stress: No Stress Concern Present (05/12/2017)   [employer] of Occupational Health - Occupational Stress Questionnaire    Feeling of Stress : Not at all  Social Connections: Moderately Integrated (05/12/2017)   Social Connection and Isolation Panel    Frequency of Communication with Friends and Family: More than three times a week    Frequency of Social Gatherings with Friends and Family: More than three times a week    Attends Religious Services: More than 4 times per year    Active Member of [employer] or Organizations: No    Attends Banker Meetings: Never    Marital Status: Married  Catering manager Violence: Not At Risk (02/07/2024)   Humiliation, Afraid, Rape, and Kick questionnaire    Fear of Current or Ex-Partner: No    Emotionally Abused: No    Physically Abused: No    Sexually Abused: No       Review of Systems  HENT:  Negative for dental problem (Followed at dental clinic twice yearly.).   Eyes:  Positive for visual disturbance (Blurry.  Uses reading glasses--better with glasses.).  Respiratory:         Problems breathing through nostril--especially on left.  Cardiovascular:  Negative for chest pain.  Gastrointestinal:  Positive for abdominal distention (acid into throat.  3 times weekly.  No HOB elevation.  after greasy meals or spicy. 3 cups coffee daily.  No alcohol or tobacco.  minimal chocolate.  minimally better when stopped fish oil.).  Neurological:  Positive for dizziness (with headache.  mildly off balance at times.) and headaches (Bilateral temporal areas of head and around to the occipital area.  wakes her in morning and starts as sharp.  Unable to describe the pain.  Has neck pain with this.  Has not tried a new pillow.  No clear photophobia.  Can have nausea associated.  For 1 mo). Negative for weakness and numbness.      Objective:   BP (!) 140/90 (BP Location: Left Arm, Patient Position: Sitting, Cuff Size: Normal)   Pulse 75   Resp 17   Ht 5' 2 (1.575 m)   Wt 174 lb (78.9 kg)   LMP  (LMP Unknown) Comment: No period since September 2019  BMI 31.83 kg/m   Physical Exam Constitutional:      Appearance: She is obese.  HENT:     Head: Normocephalic and atraumatic.     Right Ear: Ear canal and external ear normal.     Left Ear: Tympanic membrane, ear canal and external ear normal.     Ears:     Comments: Right TM dull.      Nose: Congestion (turbinates moderately swollen bilaterally) and rhinorrhea present.     Mouth/Throat:     Mouth: Mucous membranes are moist.     Comments: Mild cobbling of posterior pharynx Eyes:     Extraocular Movements: Extraocular movements intact.     Conjunctiva/sclera: Conjunctivae normal.     Pupils: Pupils are equal, round, and reactive to light.     Comments: Discs sharp  Neck:     Thyroid: No thyroid mass or  thyromegaly.  Cardiovascular:     Rate and Rhythm: Normal rate and regular rhythm.     Heart sounds: S1 normal and S2 normal. No murmur heard.    No friction rub. No S3 or S4 sounds.     Comments: No carotid bruits.  Carotid, radial, femoral, DP and PT pulses normal and equal.   Pulmonary:     Effort: Pulmonary effort is normal.     Breath sounds: Normal breath sounds and air entry.  Chest:  Breasts:    Right: No inverted nipple, mass or nipple discharge.     Left: No inverted nipple, mass or nipple discharge.  Abdominal:     General: Bowel sounds are normal.     Palpations: Abdomen is soft. There is no hepatomegaly, splenomegaly or mass.     Tenderness: There is no abdominal tenderness.     Hernia: No hernia is present.  Genitourinary:    Comments: Normal external genitalia. No uterine or adnexal mass or tenderness Musculoskeletal:        General: Normal range of motion.     Right shoulder: No tenderness.     Left shoulder: Tenderness (over trap) present.     Cervical back: Normal range of motion and neck supple.     Right lower leg: No edema.     Left lower leg: No edema.     Comments: Deformities of fingers appear to have stabilized.  No erythema or synovial thickening, tenderness.  Subcutaneous nodule over PIP middle finger.  No nodules over elbows noted today as previously  Lymphadenopathy:     Head:     Right side of head: No submental or submandibular adenopathy.     Left side of head: No submental or submandibular adenopathy.     Cervical: No cervical adenopathy.     Upper Body:  Right upper body: No supraclavicular or axillary adenopathy.     Left upper body: No supraclavicular or axillary adenopathy.     Lower Body: No right inguinal adenopathy. No left inguinal adenopathy.  Skin:    General: Skin is warm.     Capillary Refill: Capillary refill takes less than 2 seconds.     Findings: No rash.  Neurological:     General: No focal deficit present.     Mental  Status: She is alert and oriented to person, place, and time.     Cranial Nerves: Cranial nerves 2-12 are intact.     Sensory: Sensation is intact.     Motor: Motor function is intact.     Coordination: Coordination is intact.     Gait: Gait is intact.     Deep Tendon Reflexes: Reflexes are normal and symmetric.  Psychiatric:        Mood and Affect: Mood normal.        Speech: Speech normal.        Behavior: Behavior normal. Behavior is cooperative.      Assessment & Plan   CPE without pap Mammogram with [facility] FIT to return in 2 weeks.   Hep B #2/3  Will give 3rd in 3 month follow up Return for influenza vaccine on Friday.  2.  Prediabetes: improved  3.  High trigs/low HDL:  fenofibrate  145 mg daily.  FLP in 3 months.  4.  Vitamin D  deficiency:  restart supplementation and recheck lab in 3 months.  Also to take Calcium  citrate 500 mg twice daily.    5.  Headaches:  likely multifactorial:  PT referral in [place].  Treat allergies--she is likely not taking Flonase , Singulair  or antihistamine as maintains as not written for some time.  All OTC save for Singulair .   Refilled.  To take regularly after neti pot.   Eye check as decrease visual acuity as well.  6.  Decreased visual acuity:  optometry referral.    7.  Allergies:  as above.

## 2024-02-07 NOTE — Patient Instructions (Signed)
 Neti pot at pharmacy Distilled water boiled and cool to body temp Add salt packet and shake Half of pot to each nostril.   Use before Flonase  daily.

## 2024-02-21 ENCOUNTER — Other Ambulatory Visit (INDEPENDENT_AMBULATORY_CARE_PROVIDER_SITE_OTHER): Payer: Self-pay

## 2024-02-21 DIAGNOSIS — Z1211 Encounter for screening for malignant neoplasm of colon: Secondary | ICD-10-CM

## 2024-02-21 LAB — POC FIT TEST STOOL: Fecal Occult Blood: NEGATIVE

## 2024-02-27 ENCOUNTER — Ambulatory Visit: Payer: Self-pay | Admitting: Internal Medicine

## 2024-04-18 ENCOUNTER — Encounter

## 2024-05-08 ENCOUNTER — Other Ambulatory Visit: Payer: Self-pay

## 2024-05-08 DIAGNOSIS — E785 Hyperlipidemia, unspecified: Secondary | ICD-10-CM

## 2024-05-08 DIAGNOSIS — Z23 Encounter for immunization: Secondary | ICD-10-CM

## 2024-05-08 DIAGNOSIS — E559 Vitamin D deficiency, unspecified: Secondary | ICD-10-CM

## 2024-05-08 NOTE — Progress Notes (Signed)
 hep

## 2024-05-09 LAB — VITAMIN D 25 HYDROXY (VIT D DEFICIENCY, FRACTURES): Vit D, 25-Hydroxy: 26.7 ng/mL — ABNORMAL LOW (ref 30.0–100.0)

## 2024-05-09 LAB — LIPID PANEL W/O CHOL/HDL RATIO
Cholesterol, Total: 157 mg/dL (ref 100–199)
HDL: 24 mg/dL — ABNORMAL LOW (ref >39)
LDL Chol Calc (NIH): 63 mg/dL (ref 0–99)
Triglycerides: 458 mg/dL — ABNORMAL HIGH (ref 0–149)
VLDL Cholesterol Cal: 70 mg/dL — ABNORMAL HIGH (ref 5–40)

## 2025-02-07 ENCOUNTER — Other Ambulatory Visit: Payer: Self-pay

## 2025-02-12 ENCOUNTER — Encounter: Payer: Self-pay | Admitting: Internal Medicine
